# Patient Record
Sex: Female | Born: 1975 | Smoking: Never smoker
Health system: Southern US, Community
[De-identification: ages and names within clinical notes are randomized; demographics above are authoritative.]

## PROBLEM LIST (undated history)

## (undated) DIAGNOSIS — Z789 Other specified health status: Secondary | ICD-10-CM

## (undated) HISTORY — PX: NO PAST SURGERIES: SHX2092

---

## 2016-07-22 LAB — OB RESULTS CONSOLE RUBELLA ANTIBODY, IGM: Rubella: IMMUNE

## 2016-07-22 LAB — CYTOLOGY - PAP
Cystic Fibrosis Profile: NEGATIVE
Drug Screen, Urine: NEGATIVE
Glucose 1 Hr Prenatal, POC: 137 mg/dL
Pap: NEGATIVE
Urine Culture, OB: NEGATIVE

## 2016-07-22 LAB — OB RESULTS CONSOLE ABO/RH: RH TYPE: POSITIVE

## 2016-07-22 LAB — OB RESULTS CONSOLE ANTIBODY SCREEN: ANTIBODY SCREEN: NEGATIVE

## 2016-07-22 LAB — OB RESULTS CONSOLE RPR: RPR: NONREACTIVE

## 2016-07-22 LAB — OB RESULTS CONSOLE GC/CHLAMYDIA
Chlamydia: NEGATIVE
Gonorrhea: NEGATIVE

## 2016-07-22 LAB — OB RESULTS CONSOLE HIV ANTIBODY (ROUTINE TESTING): HIV: NONREACTIVE

## 2016-07-22 LAB — OB RESULTS CONSOLE HEPATITIS B SURFACE ANTIGEN: Hepatitis B Surface Ag: NEGATIVE

## 2016-07-22 LAB — OB RESULTS CONSOLE HGB/HCT, BLOOD
HCT: 31 %
HEMOGLOBIN: 9.6 g/dL

## 2016-07-22 LAB — OB RESULTS CONSOLE PLATELET COUNT: PLATELETS: 248 10*3/uL

## 2016-07-23 ENCOUNTER — Other Ambulatory Visit: Payer: Self-pay | Admitting: Nurse Practitioner

## 2016-07-23 DIAGNOSIS — N6310 Unspecified lump in the right breast, unspecified quadrant: Secondary | ICD-10-CM

## 2016-07-31 LAB — OB RESULTS CONSOLE PLATELET COUNT: PLATELETS: 31 10*3/uL

## 2016-07-31 LAB — GLUCOSE, 3 HOUR

## 2016-07-31 LAB — OB RESULTS CONSOLE HGB/HCT, BLOOD: HEMATOCRIT: 10 %

## 2016-07-31 LAB — OB RESULTS CONSOLE RPR: RPR: NONREACTIVE

## 2016-08-02 ENCOUNTER — Other Ambulatory Visit: Payer: Self-pay

## 2016-08-21 ENCOUNTER — Ambulatory Visit (INDEPENDENT_AMBULATORY_CARE_PROVIDER_SITE_OTHER): Payer: Medicaid Other | Admitting: Obstetrics and Gynecology

## 2016-08-21 ENCOUNTER — Encounter: Payer: Self-pay | Admitting: Obstetrics and Gynecology

## 2016-08-21 DIAGNOSIS — O133 Gestational [pregnancy-induced] hypertension without significant proteinuria, third trimester: Secondary | ICD-10-CM

## 2016-08-21 DIAGNOSIS — O09523 Supervision of elderly multigravida, third trimester: Secondary | ICD-10-CM | POA: Diagnosis not present

## 2016-08-21 DIAGNOSIS — O0993 Supervision of high risk pregnancy, unspecified, third trimester: Secondary | ICD-10-CM | POA: Diagnosis not present

## 2016-08-21 DIAGNOSIS — O09529 Supervision of elderly multigravida, unspecified trimester: Secondary | ICD-10-CM

## 2016-08-21 DIAGNOSIS — O0990 Supervision of high risk pregnancy, unspecified, unspecified trimester: Secondary | ICD-10-CM

## 2016-08-21 DIAGNOSIS — O139 Gestational [pregnancy-induced] hypertension without significant proteinuria, unspecified trimester: Secondary | ICD-10-CM

## 2016-08-21 DIAGNOSIS — N63 Unspecified lump in unspecified breast: Secondary | ICD-10-CM

## 2016-08-21 LAB — POCT URINALYSIS DIP (DEVICE)
Bilirubin Urine: NEGATIVE
GLUCOSE, UA: NEGATIVE mg/dL
HGB URINE DIPSTICK: NEGATIVE
Ketones, ur: NEGATIVE mg/dL
LEUKOCYTES UA: NEGATIVE
NITRITE: NEGATIVE
PROTEIN: NEGATIVE mg/dL
Specific Gravity, Urine: 1.02 (ref 1.005–1.030)
UROBILINOGEN UA: 0.2 mg/dL (ref 0.0–1.0)
pH: 5.5 (ref 5.0–8.0)

## 2016-08-21 NOTE — Progress Notes (Signed)
   PRENATAL VISIT NOTE  Subjective:  Savannah [name] is a 41 y.o. G1P0 at 5465w5d being seen today for ongoing prenatal care.  She is currently monitored for the following issues for this high-risk pregnancy and has Supervision of high risk pregnancy, antepartum on her problem list.  Patient reports no complaints.  Contractions: Not present. Vag. Bleeding: None.  Movement: Present. Denies leaking of fluid.    Patient moved from [place] recently. She is doing well. Her husband lives in the [place] but currently is working in [place]. She has no other family here. She reports no medical problems. She had one evelated BP at the heatlh department and so was referred to us for evaluation of Gestational hypertension. She has no other complaints.   The following portions of the patient's history were reviewed and updated as appropriate: allergies, current medications, past family history, past medical history, past social history, past surgical history and problem list. Problem list updated.  Objective:   Vitals:   08/21/16 0831  BP: 111/62  Pulse: 100  Weight: 161 lb 14.4 oz (73.4 kg)    Fetal Status: Fetal Heart Rate (bpm): 155   Movement: Present     General:  Alert, oriented and cooperative. Patient is in no acute distress.  Skin: Skin is warm and dry. No rash noted.   Cardiovascular: Normal heart rate noted, no m/r/g  Respiratory: Normal respiratory effort, CTAB   Abdomen: Soft, gravid, appropriate for gestational age. Pain/Pressure: Absent     Pelvic:  Cervical exam deferred        Extremities: Normal range of motion.  Edema: None  Mental Status: Normal mood and affect. Normal behavior. Normal judgment and thought content.   Assessment and Plan:  Pregnancy: G1P0 at 4065w5d  1. Pregnancy induced hypertension, antepartum -referred from [facility]. Patient has had a single elevated Bp to 140/90's. It is normal today. Will follow up closely, but no need to start testing at this time.  -no need to start  ASA at advanced gestation.    2. Supervision of high risk pregnancy, antepartum Labs reviewed from the health department all with in normal limits US reviewed - septal anatomy of the heart no well visualized, but no abnormalities seen. No need for repeat imaging at this time.  Preterm labor symptoms and general obstetric precautions including but not limited to vaginal bleeding, contractions, leaking of fluid and fetal movement were reviewed in detail with the patient. Please refer to After Visit Summary for other counseling recommendations.  Return in about 1 week (around 08/28/2016) for HROB.   Lorne SkeensSchenk, Jaliah Foody Michael, MD

## 2016-08-21 NOTE — Progress Notes (Signed)
Initial prenatal visit- transferring care from [facility] . [place] in [place]. Given prenatal education booklets. Signed up for babyscripps app.

## 2016-08-21 NOTE — Patient Instructions (Signed)
Third Trimester of Pregnancy The third trimester is from week 28 through week 40 (months 7 through 9). The third trimester is a time when the unborn baby (fetus) is growing rapidly. At the end of the ninth month, the fetus is about 20 inches in length and weighs 6-10 pounds. Body changes during your third trimester Your body will continue to go through many changes during pregnancy. The changes vary from woman to woman. During the third trimester:  Your weight will continue to increase. You can expect to gain 25-35 pounds (11-16 kg) by the end of the pregnancy.  You may begin to get stretch marks on your hips, abdomen, and breasts.  You may urinate more often because the fetus is moving lower into your pelvis and pressing on your bladder.  You may develop or continue to have heartburn. This is caused by increased hormones that slow down muscles in the digestive tract.  You may develop or continue to have constipation because increased hormones slow digestion and cause the muscles that push waste through your intestines to relax.  You may develop hemorrhoids. These are swollen veins (varicose veins) in the rectum that can itch or be painful.  You may develop swollen, bulging veins (varicose veins) in your legs.  You may have increased body aches in the pelvis, back, or thighs. This is due to weight gain and increased hormones that are relaxing your joints.  You may have changes in your hair. These can include thickening of your hair, rapid growth, and changes in texture. Some women also have hair loss during or after pregnancy, or hair that feels dry or thin. Your hair will most likely return to normal after your baby is born.  Your breasts will continue to grow and they will continue to become tender. A yellow fluid (colostrum) may leak from your breasts. This is the first milk you are producing for your baby.  Your belly button may stick out.  You may notice more swelling in your hands,  face, or ankles.  You may have increased tingling or numbness in your hands, arms, and legs. The skin on your belly may also feel numb.  You may feel short of breath because of your expanding uterus.  You may have more problems sleeping. This can be caused by the size of your belly, increased need to urinate, and an increase in your body's metabolism.  You may notice the fetus "dropping," or moving lower in your abdomen (lightening).  You may have increased vaginal discharge.  You may notice your joints feel loose and you may have pain around your pelvic bone.  What to expect at prenatal visits You will have prenatal exams every 2 weeks until week 36. Then you will have weekly prenatal exams. During a routine prenatal visit:  You will be weighed to make sure you and the baby are growing normally.  Your blood pressure will be taken.  Your abdomen will be measured to track your baby's growth.  The fetal heartbeat will be listened to.  Any test results from the previous visit will be discussed.  You may have a cervical check near your due date to see if your cervix has softened or thinned (effaced).  You will be tested for Group B streptococcus. This happens between 35 and 37 weeks.  Your health care provider may ask you:  What your birth plan is.  How you are feeling.  If you are feeling the baby move.  If you have had   any abnormal symptoms, such as leaking fluid, bleeding, severe headaches, or abdominal cramping.  If you are using any tobacco products, including cigarettes, chewing tobacco, and electronic cigarettes.  If you have any questions.  Other tests or screenings that may be performed during your third trimester include:  Blood tests that check for low iron levels (anemia).  Fetal testing to check the health, activity level, and growth of the fetus. Testing is done if you have certain medical conditions or if there are problems during the  pregnancy.  Nonstress test (NST). This test checks the health of your baby to make sure there are no signs of problems, such as the baby not getting enough oxygen. During this test, a belt is placed around your belly. The baby is made to move, and its heart rate is monitored during movement.  What is false labor? False labor is a condition in which you feel small, irregular tightenings of the muscles in the womb (contractions) that usually go away with rest, changing position, or drinking water. These are called Braxton Hicks contractions. Contractions may last for hours, days, or even weeks before true labor sets in. If contractions come at regular intervals, become more frequent, increase in intensity, or become painful, you should see your health care provider. What are the signs of labor?  Abdominal cramps.  Regular contractions that start at 10 minutes apart and become stronger and more frequent with time.  Contractions that start on the top of the uterus and spread down to the lower abdomen and back.  Increased pelvic pressure and dull back pain.  A watery or bloody mucus discharge that comes from the vagina.  Leaking of amniotic fluid. This is also known as your "water breaking." It could be a slow trickle or a gush. Let your health care provider know if it has a color or strange odor. If you have any of these signs, call your health care provider right away, even if it is before your due date. Follow these instructions at home: Medicines  Follow your health care provider's instructions regarding medicine use. Specific medicines may be either safe or unsafe to take during pregnancy.  Take a prenatal vitamin that contains at least 600 micrograms (mcg) of folic acid.  If you develop constipation, try taking a stool softener if your health care provider approves. Eating and drinking  Eat a balanced diet that includes fresh fruits and vegetables, whole grains, good sources of protein  such as meat, eggs, or tofu, and low-fat dairy. Your health care provider will help you determine the amount of weight gain that is right for you.  Avoid raw meat and uncooked cheese. These carry germs that can cause birth defects in the baby.  If you have low calcium intake from food, talk to your health care provider about whether you should take a daily calcium supplement.  Eat four or five small meals rather than three large meals a day.  Limit foods that are high in fat and processed sugars, such as fried and sweet foods.  To prevent constipation: ? Drink enough fluid to keep your urine clear or pale yellow. ? Eat foods that are high in fiber, such as fresh fruits and vegetables, whole grains, and beans. Activity  Exercise only as directed by your health care provider. Most women can continue their usual exercise routine during pregnancy. Try to exercise for 30 minutes at least 5 days a week. Stop exercising if you experience uterine contractions.  Avoid heavy   lifting.  Do not exercise in extreme heat or humidity, or at high altitudes.  Wear low-heel, comfortable shoes.  Practice good posture.  You may continue to have sex unless your health care provider tells you otherwise. Relieving pain and discomfort  Take frequent breaks and rest with your legs elevated if you have leg cramps or low back pain.  Take warm sitz baths to soothe any pain or discomfort caused by hemorrhoids. Use hemorrhoid cream if your health care provider approves.  Wear a good support bra to prevent discomfort from breast tenderness.  If you develop varicose veins: ? Wear support pantyhose or compression stockings as told by your healthcare provider. ? Elevate your feet for 15 minutes, 3-4 times a day. Prenatal care  Write down your questions. Take them to your prenatal visits.  Keep all your prenatal visits as told by your health care provider. This is important. Safety  Wear your seat belt at  all times when driving.  Make a list of emergency phone numbers, including numbers for family, friends, the hospital, and police and fire departments. General instructions  Avoid cat litter boxes and soil used by cats. These carry germs that can cause birth defects in the baby. If you have a cat, ask someone to clean the litter box for you.  Do not travel far distances unless it is absolutely necessary and only with the approval of your health care provider.  Do not use hot tubs, steam rooms, or saunas.  Do not drink alcohol.  Do not use any products that contain nicotine or tobacco, such as cigarettes and e-cigarettes. If you need help quitting, ask your health care provider.  Do not use any medicinal herbs or unprescribed drugs. These chemicals affect the formation and growth of the baby.  Do not douche or use tampons or scented sanitary pads.  Do not cross your legs for long periods of time.  To prepare for the arrival of your baby: ? Take prenatal classes to understand, practice, and ask questions about labor and delivery. ? Make a trial run to the hospital. ? Visit the hospital and tour the maternity area. ? Arrange for maternity or paternity leave through employers. ? Arrange for family and friends to take care of pets while you are in the hospital. ? Purchase a rear-facing car seat and make sure you know how to install it in your car. ? Pack your hospital bag. ? Prepare the baby's nursery. Make sure to remove all pillows and stuffed animals from the baby's crib to prevent suffocation.  Visit your dentist if you have not gone during your pregnancy. Use a soft toothbrush to brush your teeth and be gentle when you floss. Contact a health care provider if:  You are unsure if you are in labor or if your water has broken.  You become dizzy.  You have mild pelvic cramps, pelvic pressure, or nagging pain in your abdominal area.  You have lower back pain.  You have persistent  nausea, vomiting, or diarrhea.  You have an unusual or bad smelling vaginal discharge.  You have pain when you urinate. Get help right away if:  Your water breaks before 37 weeks.  You have regular contractions less than 5 minutes apart before 37 weeks.  You have a fever.  You are leaking fluid from your vagina.  You have spotting or bleeding from your vagina.  You have severe abdominal pain or cramping.  You have rapid weight loss or weight gain.    You have shortness of breath with chest pain.  You notice sudden or extreme swelling of your face, hands, ankles, feet, or legs.  Your baby makes fewer than 10 movements in 2 hours.  You have severe headaches that do not go away when you take medicine.  You have vision changes. Summary  The third trimester is from week 28 through week 40, months 7 through 9. The third trimester is a time when the unborn baby (fetus) is growing rapidly.  During the third trimester, your discomfort may increase as you and your baby continue to gain weight. You may have abdominal, leg, and back pain, sleeping problems, and an increased need to urinate.  During the third trimester your breasts will keep growing and they will continue to become tender. A yellow fluid (colostrum) may leak from your breasts. This is the first milk you are producing for your baby.  False labor is a condition in which you feel small, irregular tightenings of the muscles in the womb (contractions) that eventually go away. These are called Braxton Hicks contractions. Contractions may last for hours, days, or even weeks before true labor sets in.  Signs of labor can include: abdominal cramps; regular contractions that start at 10 minutes apart and become stronger and more frequent with time; watery or bloody mucus discharge that comes from the vagina; increased pelvic pressure and dull back pain; and leaking of amniotic fluid. This information is not intended to replace advice  given to you by your health care provider. Make sure you discuss any questions you have with your health care provider. Document Released: 03/26/2001 Document Revised: 09/07/2015 Document Reviewed: 06/02/2012 Elsevier Interactive Patient Education  2017 [employer]  

## 2016-08-22 ENCOUNTER — Encounter: Payer: Self-pay | Admitting: *Deleted

## 2016-08-26 ENCOUNTER — Encounter: Payer: Self-pay | Admitting: Obstetrics and Gynecology

## 2016-08-26 DIAGNOSIS — O09899 Supervision of other high risk pregnancies, unspecified trimester: Secondary | ICD-10-CM | POA: Insufficient documentation

## 2016-08-26 DIAGNOSIS — Z283 Underimmunization status: Secondary | ICD-10-CM

## 2016-08-28 ENCOUNTER — Encounter: Payer: Self-pay | Admitting: Family Medicine

## 2016-08-28 ENCOUNTER — Telehealth: Payer: Self-pay | Admitting: General Practice

## 2016-09-03 ENCOUNTER — Ambulatory Visit (INDEPENDENT_AMBULATORY_CARE_PROVIDER_SITE_OTHER): Payer: Self-pay | Admitting: Obstetrics and Gynecology

## 2016-09-03 VITALS — BP 122/78 | HR 105 | Wt 165.4 lb

## 2016-09-03 DIAGNOSIS — O0990 Supervision of high risk pregnancy, unspecified, unspecified trimester: Secondary | ICD-10-CM

## 2016-09-03 DIAGNOSIS — O0993 Supervision of high risk pregnancy, unspecified, third trimester: Secondary | ICD-10-CM

## 2016-09-03 DIAGNOSIS — O09523 Supervision of elderly multigravida, third trimester: Secondary | ICD-10-CM

## 2016-09-03 DIAGNOSIS — O09529 Supervision of elderly multigravida, unspecified trimester: Secondary | ICD-10-CM

## 2016-09-03 MED ORDER — PRENATAL VITAMIN PLUS LOW IRON 27-1 MG PO TABS: 1.0000 | ORAL_TABLET | Freq: Every day | ORAL | 9 refills | Status: AC

## 2016-09-03 NOTE — Progress Notes (Signed)
   PRENATAL VISIT NOTE  Subjective:  Savannah [name] is a 41 y.o. G2P0 at 6849w6d being seen today for ongoing prenatal care.  She is currently monitored for the following issues for this low-risk pregnancy and has Supervision of high risk pregnancy, antepartum; AMA (advanced maternal age) multigravida 35+; Breast lump; and Susceptible to varicella (non-immune), currently pregnant on her problem list.  Patient reports no complaints.  Contractions: Not present. Vag. Bleeding: None.  Movement: Present. Denies leaking of fluid.   The following portions of the patient's history were reviewed and updated as appropriate: allergies, current medications, past family history, past medical history, past social history, past surgical history and problem list. Problem list updated.  Objective:   Vitals:   09/03/16 1420  BP: 122/78  Pulse: (!) 105  Weight: 165 lb 6.4 oz (75 kg)    Fetal Status: Fetal Heart Rate (bpm): 148   Movement: Present     General:  Alert, oriented and cooperative. Patient is in no acute distress.  Skin: Skin is warm and dry. No rash noted.   Cardiovascular: Normal heart rate noted  Respiratory: Normal respiratory effort, no problems with respiration noted  Abdomen: Soft, gravid, appropriate for gestational age. Pain/Pressure: Absent     Pelvic:  Cervical exam deferred        Extremities: Normal range of motion.  Edema: None  Mental Status: Normal mood and affect. Normal behavior. Normal judgment and thought content.   Assessment and Plan:  Pregnancy: G2P0 at 6749w6d  1. Supervision of high risk pregnancy, antepartum - Prenatal Vit-Fe Fumarate-FA (PRENATAL VITAMIN PLUS LOW IRON) 27-1 MG TABS; Take 1 tablet by mouth daily.  Dispense: 30 tablet; Refill: 9 -BP remains normal - 1 elevated BP at health department - very anxious in the health care setting.  2. Anteprtum multigravida of advanced maternal age - Prenatal Vit-Fe Fumarate-FA (PRENATAL VITAMIN PLUS LOW IRON) 27-1  MG TABS; Take 1 tablet by mouth daily.  Dispense: 30 tablet; Refill: 9  Preterm labor symptoms and general obstetric precautions including but not limited to vaginal bleeding, contractions, leaking of fluid and fetal movement were reviewed in detail with the patient. Please refer to After Visit Summary for other counseling recommendations.  Return in about 2 weeks (around 09/17/2016) for LOB.   Ernestina PennaNicholas Schenk, MD

## 2016-09-03 NOTE — Patient Instructions (Signed)
Third Trimester of Pregnancy The third trimester is from week 28 through week 40 (months 7 through 9). The third trimester is a time when the unborn baby (fetus) is growing rapidly. At the end of the ninth month, the fetus is about 20 inches in length and weighs 6-10 pounds. Body changes during your third trimester Your body will continue to go through many changes during pregnancy. The changes vary from woman to woman. During the third trimester:  Your weight will continue to increase. You can expect to gain 25-35 pounds (11-16 kg) by the end of the pregnancy.  You may begin to get stretch marks on your hips, abdomen, and breasts.  You may urinate more often because the fetus is moving lower into your pelvis and pressing on your bladder.  You may develop or continue to have heartburn. This is caused by increased hormones that slow down muscles in the digestive tract.  You may develop or continue to have constipation because increased hormones slow digestion and cause the muscles that push waste through your intestines to relax.  You may develop hemorrhoids. These are swollen veins (varicose veins) in the rectum that can itch or be painful.  You may develop swollen, bulging veins (varicose veins) in your legs.  You may have increased body aches in the pelvis, back, or thighs. This is due to weight gain and increased hormones that are relaxing your joints.  You may have changes in your hair. These can include thickening of your hair, rapid growth, and changes in texture. Some women also have hair loss during or after pregnancy, or hair that feels dry or thin. Your hair will most likely return to normal after your baby is born.  Your breasts will continue to grow and they will continue to become tender. A yellow fluid (colostrum) may leak from your breasts. This is the first milk you are producing for your baby.  Your belly button may stick out.  You may notice more swelling in your hands,  face, or ankles.  You may have increased tingling or numbness in your hands, arms, and legs. The skin on your belly may also feel numb.  You may feel short of breath because of your expanding uterus.  You may have more problems sleeping. This can be caused by the size of your belly, increased need to urinate, and an increase in your body's metabolism.  You may notice the fetus "dropping," or moving lower in your abdomen (lightening).  You may have increased vaginal discharge.  You may notice your joints feel loose and you may have pain around your pelvic bone.  What to expect at prenatal visits You will have prenatal exams every 2 weeks until week 36. Then you will have weekly prenatal exams. During a routine prenatal visit:  You will be weighed to make sure you and the baby are growing normally.  Your blood pressure will be taken.  Your abdomen will be measured to track your baby's growth.  The fetal heartbeat will be listened to.  Any test results from the previous visit will be discussed.  You may have a cervical check near your due date to see if your cervix has softened or thinned (effaced).  You will be tested for Group B streptococcus. This happens between 35 and 37 weeks.  Your health care provider may ask you:  What your birth plan is.  How you are feeling.  If you are feeling the baby move.  If you have had   any abnormal symptoms, such as leaking fluid, bleeding, severe headaches, or abdominal cramping.  If you are using any tobacco products, including cigarettes, chewing tobacco, and electronic cigarettes.  If you have any questions.  Other tests or screenings that may be performed during your third trimester include:  Blood tests that check for low iron levels (anemia).  Fetal testing to check the health, activity level, and growth of the fetus. Testing is done if you have certain medical conditions or if there are problems during the  pregnancy.  Nonstress test (NST). This test checks the health of your baby to make sure there are no signs of problems, such as the baby not getting enough oxygen. During this test, a belt is placed around your belly. The baby is made to move, and its heart rate is monitored during movement.  What is false labor? False labor is a condition in which you feel small, irregular tightenings of the muscles in the womb (contractions) that usually go away with rest, changing position, or drinking water. These are called Braxton Hicks contractions. Contractions may last for hours, days, or even weeks before true labor sets in. If contractions come at regular intervals, become more frequent, increase in intensity, or become painful, you should see your health care provider. What are the signs of labor?  Abdominal cramps.  Regular contractions that start at 10 minutes apart and become stronger and more frequent with time.  Contractions that start on the top of the uterus and spread down to the lower abdomen and back.  Increased pelvic pressure and dull back pain.  A watery or bloody mucus discharge that comes from the vagina.  Leaking of amniotic fluid. This is also known as your "water breaking." It could be a slow trickle or a gush. Let your health care provider know if it has a color or strange odor. If you have any of these signs, call your health care provider right away, even if it is before your due date. Follow these instructions at home: Medicines  Follow your health care provider's instructions regarding medicine use. Specific medicines may be either safe or unsafe to take during pregnancy.  Take a prenatal vitamin that contains at least 600 micrograms (mcg) of folic acid.  If you develop constipation, try taking a stool softener if your health care provider approves. Eating and drinking  Eat a balanced diet that includes fresh fruits and vegetables, whole grains, good sources of protein  such as meat, eggs, or tofu, and low-fat dairy. Your health care provider will help you determine the amount of weight gain that is right for you.  Avoid raw meat and uncooked cheese. These carry germs that can cause birth defects in the baby.  If you have low calcium intake from food, talk to your health care provider about whether you should take a daily calcium supplement.  Eat four or five small meals rather than three large meals a day.  Limit foods that are high in fat and processed sugars, such as fried and sweet foods.  To prevent constipation: ? Drink enough fluid to keep your urine clear or pale yellow. ? Eat foods that are high in fiber, such as fresh fruits and vegetables, whole grains, and beans. Activity  Exercise only as directed by your health care provider. Most women can continue their usual exercise routine during pregnancy. Try to exercise for 30 minutes at least 5 days a week. Stop exercising if you experience uterine contractions.  Avoid heavy   lifting.  Do not exercise in extreme heat or humidity, or at high altitudes.  Wear low-heel, comfortable shoes.  Practice good posture.  You may continue to have sex unless your health care provider tells you otherwise. Relieving pain and discomfort  Take frequent breaks and rest with your legs elevated if you have leg cramps or low back pain.  Take warm sitz baths to soothe any pain or discomfort caused by hemorrhoids. Use hemorrhoid cream if your health care provider approves.  Wear a good support bra to prevent discomfort from breast tenderness.  If you develop varicose veins: ? Wear support pantyhose or compression stockings as told by your healthcare provider. ? Elevate your feet for 15 minutes, 3-4 times a day. Prenatal care  Write down your questions. Take them to your prenatal visits.  Keep all your prenatal visits as told by your health care provider. This is important. Safety  Wear your seat belt at  all times when driving.  Make a list of emergency phone numbers, including numbers for family, friends, the hospital, and police and fire departments. General instructions  Avoid cat litter boxes and soil used by cats. These carry germs that can cause birth defects in the baby. If you have a cat, ask someone to clean the litter box for you.  Do not travel far distances unless it is absolutely necessary and only with the approval of your health care provider.  Do not use hot tubs, steam rooms, or saunas.  Do not drink alcohol.  Do not use any products that contain nicotine or tobacco, such as cigarettes and e-cigarettes. If you need help quitting, ask your health care provider.  Do not use any medicinal herbs or unprescribed drugs. These chemicals affect the formation and growth of the baby.  Do not douche or use tampons or scented sanitary pads.  Do not cross your legs for long periods of time.  To prepare for the arrival of your baby: ? Take prenatal classes to understand, practice, and ask questions about labor and delivery. ? Make a trial run to the hospital. ? Visit the hospital and tour the maternity area. ? Arrange for maternity or paternity leave through employers. ? Arrange for family and friends to take care of pets while you are in the hospital. ? Purchase a rear-facing car seat and make sure you know how to install it in your car. ? Pack your hospital bag. ? Prepare the baby's nursery. Make sure to remove all pillows and stuffed animals from the baby's crib to prevent suffocation.  Visit your dentist if you have not gone during your pregnancy. Use a soft toothbrush to brush your teeth and be gentle when you floss. Contact a health care provider if:  You are unsure if you are in labor or if your water has broken.  You become dizzy.  You have mild pelvic cramps, pelvic pressure, or nagging pain in your abdominal area.  You have lower back pain.  You have persistent  nausea, vomiting, or diarrhea.  You have an unusual or bad smelling vaginal discharge.  You have pain when you urinate. Get help right away if:  Your water breaks before 37 weeks.  You have regular contractions less than 5 minutes apart before 37 weeks.  You have a fever.  You are leaking fluid from your vagina.  You have spotting or bleeding from your vagina.  You have severe abdominal pain or cramping.  You have rapid weight loss or weight gain.    You have shortness of breath with chest pain.  You notice sudden or extreme swelling of your face, hands, ankles, feet, or legs.  Your baby makes fewer than 10 movements in 2 hours.  You have severe headaches that do not go away when you take medicine.  You have vision changes. Summary  The third trimester is from week 28 through week 40, months 7 through 9. The third trimester is a time when the unborn baby (fetus) is growing rapidly.  During the third trimester, your discomfort may increase as you and your baby continue to gain weight. You may have abdominal, leg, and back pain, sleeping problems, and an increased need to urinate.  During the third trimester your breasts will keep growing and they will continue to become tender. A yellow fluid (colostrum) may leak from your breasts. This is the first milk you are producing for your baby.  False labor is a condition in which you feel small, irregular tightenings of the muscles in the womb (contractions) that eventually go away. These are called Braxton Hicks contractions. Contractions may last for hours, days, or even weeks before true labor sets in.  Signs of labor can include: abdominal cramps; regular contractions that start at 10 minutes apart and become stronger and more frequent with time; watery or bloody mucus discharge that comes from the vagina; increased pelvic pressure and dull back pain; and leaking of amniotic fluid. This information is not intended to replace advice  given to you by your health care provider. Make sure you discuss any questions you have with your health care provider. Document Released: 03/26/2001 Document Revised: 09/07/2015 Document Reviewed: 06/02/2012 Elsevier Interactive Patient Education  2017 [employer]  

## 2016-09-23 ENCOUNTER — Ambulatory Visit: Payer: Self-pay

## 2016-09-23 ENCOUNTER — Ambulatory Visit (INDEPENDENT_AMBULATORY_CARE_PROVIDER_SITE_OTHER): Payer: Medicaid Other | Admitting: Advanced Practice Midwife

## 2016-09-23 VITALS — BP 118/72 | HR 100 | Wt 168.3 lb

## 2016-09-23 DIAGNOSIS — Z113 Encounter for screening for infections with a predominantly sexual mode of transmission: Secondary | ICD-10-CM | POA: Diagnosis present

## 2016-09-23 DIAGNOSIS — O0993 Supervision of high risk pregnancy, unspecified, third trimester: Secondary | ICD-10-CM

## 2016-09-23 DIAGNOSIS — O26843 Uterine size-date discrepancy, third trimester: Secondary | ICD-10-CM

## 2016-09-23 DIAGNOSIS — O09529 Supervision of elderly multigravida, unspecified trimester: Secondary | ICD-10-CM

## 2016-09-23 DIAGNOSIS — O09523 Supervision of elderly multigravida, third trimester: Secondary | ICD-10-CM | POA: Diagnosis not present

## 2016-09-23 DIAGNOSIS — O0990 Supervision of high risk pregnancy, unspecified, unspecified trimester: Secondary | ICD-10-CM

## 2016-09-23 LAB — OB RESULTS CONSOLE GBS: STREP GROUP B AG: POSITIVE

## 2016-09-23 LAB — OB RESULTS CONSOLE GC/CHLAMYDIA: Gonorrhea: NEGATIVE

## 2016-09-23 NOTE — Addendum Note (Signed)
Addended by: Clearnce SorrelPICKARD, JILL S on: 09/23/2016 03:40 PM   Modules accepted: Orders

## 2016-09-23 NOTE — Progress Notes (Signed)
   PRENATAL VISIT NOTE  Subjective:  Savannah [name] is a 41 y.o. G2P0 at 9471w5d being seen today for ongoing prenatal care.  She is currently monitored for the following issues for this high-risk pregnancy and has Supervision of high risk pregnancy, antepartum; AMA (advanced maternal age) multigravida 35+; Breast lump; Susceptible to varicella (non-immune), currently pregnant; and Uterine size date discrepancy, antepartum condition, third trimester on her problem list.  Patient reports occasional contractions and anxiety. .  Contractions: Not present. Vag. Bleeding: None.  Movement: Present. Denies leaking of fluid.   The following portions of the patient's history were reviewed and updated as appropriate: allergies, current medications, past family history, past medical history, past social history, past surgical history and problem list. Problem list updated.  Objective:   Vitals:   09/23/16 1256  BP: 118/72  Pulse: 100  Weight: 168 lb 4.8 oz (76.3 kg)    Fetal Status: Fetal Heart Rate (bpm): 145 Fundal Height: 41 cm Movement: Present    NST reactive, AFI borderline elevated 24 cm General:  Alert, oriented and cooperative. Patient is in no acute distress.  Skin: Skin is warm and dry. No rash noted.   Cardiovascular: Normal heart rate noted  Respiratory: Normal respiratory effort, no problems with respiration noted  Abdomen: Soft, gravid, appropriate for gestational age. Pain/Pressure: Present     Pelvic:  Cervical exam declined        Extremities: Normal range of motion.  Edema: Trace  Mental Status: Normal mood and affect. Normal behavior. Normal judgment and thought content.   Assessment and Plan:  Pregnancy: G2P0 at 7271w5d  1. Supervision of high risk pregnancy, antepartum   2. Antepartum multigravida of advanced maternal age 74>40 Recommended Growth US, Twice weekly testing and explained rationale. Pt refuses due to cost/transportation issues. Will try to come for weekly  modified BPP's.  3. Uterine size date discrepancy, antepartum condition, third trimester - Declined  4. Elderly multigravida in third trimester  - Fetal nonstress test - US OB Limited  Term labor symptoms and general obstetric precautions including but not limited to vaginal bleeding, contractions, leaking of fluid and fetal movement were reviewed in detail with the patient. Please refer to After Visit Summary for other counseling recommendations.  Return in 1 week (on 09/30/2016) for Ob fu and NST/AFI.   Dorathy KinsmanVirginia Gatlin Kittell, CNM

## 2016-09-23 NOTE — Addendum Note (Signed)
Addended by: Faythe CasaBELLAMY, Omaya Nieland M on: 09/23/2016 05:12 PM   Modules accepted: Orders

## 2016-09-23 NOTE — Addendum Note (Signed)
Addended by: Clearnce SorrelPICKARD, [place] S on: 09/23/2016 03:45 PM   Modules accepted: Orders

## 2016-09-23 NOTE — Progress Notes (Signed)
Pt informed that the ultrasound is considered a limited OB ultrasound and is not intended to be a complete ultrasound exam.  Patient also informed that the ultrasound is not being completed with the intent of assessing for fetal or placental anomalies or any pelvic abnormalities.  Explained that the purpose of today's ultrasound is to assess for presentation and amniotic fluid volume.  Patient acknowledges the purpose of the exam and the limitations of the study.    

## 2016-09-23 NOTE — Patient Instructions (Signed)
Third Trimester of Pregnancy The third trimester is from week 28 through week 40 (months 7 through 9). The third trimester is a time when the unborn baby (fetus) is growing rapidly. At the end of the ninth month, the fetus is about 20 inches in length and weighs 6-10 pounds. Body changes during your third trimester Your body will continue to go through many changes during pregnancy. The changes vary from woman to woman. During the third trimester:  Your weight will continue to increase. You can expect to gain 25-35 pounds (11-16 kg) by the end of the pregnancy.  You may begin to get stretch marks on your hips, abdomen, and breasts.  You may urinate more often because the fetus is moving lower into your pelvis and pressing on your bladder.  You may develop or continue to have heartburn. This is caused by increased hormones that slow down muscles in the digestive tract.  You may develop or continue to have constipation because increased hormones slow digestion and cause the muscles that push waste through your intestines to relax.  You may develop hemorrhoids. These are swollen veins (varicose veins) in the rectum that can itch or be painful.  You may develop swollen, bulging veins (varicose veins) in your legs.  You may have increased body aches in the pelvis, back, or thighs. This is due to weight gain and increased hormones that are relaxing your joints.  You may have changes in your hair. These can include thickening of your hair, rapid growth, and changes in texture. Some women also have hair loss during or after pregnancy, or hair that feels dry or thin. Your hair will most likely return to normal after your baby is born.  Your breasts will continue to grow and they will continue to become tender. A yellow fluid (colostrum) may leak from your breasts. This is the first milk you are producing for your baby.  Your belly button may stick out.  You may notice more swelling in your hands,  face, or ankles.  You may have increased tingling or numbness in your hands, arms, and legs. The skin on your belly may also feel numb.  You may feel short of breath because of your expanding uterus.  You may have more problems sleeping. This can be caused by the size of your belly, increased need to urinate, and an increase in your body's metabolism.  You may notice the fetus "dropping," or moving lower in your abdomen (lightening).  You may have increased vaginal discharge.  You may notice your joints feel loose and you may have pain around your pelvic bone.  What to expect at prenatal visits You will have prenatal exams every 2 weeks until week 36. Then you will have weekly prenatal exams. During a routine prenatal visit:  You will be weighed to make sure you and the baby are growing normally.  Your blood pressure will be taken.  Your abdomen will be measured to track your baby's growth.  The fetal heartbeat will be listened to.  Any test results from the previous visit will be discussed.  You may have a cervical check near your due date to see if your cervix has softened or thinned (effaced).  You will be tested for Group B streptococcus. This happens between 35 and 37 weeks.  Your health care provider may ask you:  What your birth plan is.  How you are feeling.  If you are feeling the baby move.  If you have had   any abnormal symptoms, such as leaking fluid, bleeding, severe headaches, or abdominal cramping.  If you are using any tobacco products, including cigarettes, chewing tobacco, and electronic cigarettes.  If you have any questions.  Other tests or screenings that may be performed during your third trimester include:  Blood tests that check for low iron levels (anemia).  Fetal testing to check the health, activity level, and growth of the fetus. Testing is done if you have certain medical conditions or if there are problems during the  pregnancy.  Nonstress test (NST). This test checks the health of your baby to make sure there are no signs of problems, such as the baby not getting enough oxygen. During this test, a belt is placed around your belly. The baby is made to move, and its heart rate is monitored during movement.  What is false labor? False labor is a condition in which you feel small, irregular tightenings of the muscles in the womb (contractions) that usually go away with rest, changing position, or drinking water. These are called Braxton Hicks contractions. Contractions may last for hours, days, or even weeks before true labor sets in. If contractions come at regular intervals, become more frequent, increase in intensity, or become painful, you should see your health care provider. What are the signs of labor?  Abdominal cramps.  Regular contractions that start at 10 minutes apart and become stronger and more frequent with time.  Contractions that start on the top of the uterus and spread down to the lower abdomen and back.  Increased pelvic pressure and dull back pain.  A watery or bloody mucus discharge that comes from the vagina.  Leaking of amniotic fluid. This is also known as your "water breaking." It could be a slow trickle or a gush. Let your health care provider know if it has a color or strange odor. If you have any of these signs, call your health care provider right away, even if it is before your due date. Follow these instructions at home: Medicines  Follow your health care provider's instructions regarding medicine use. Specific medicines may be either safe or unsafe to take during pregnancy.  Take a prenatal vitamin that contains at least 600 micrograms (mcg) of folic acid.  If you develop constipation, try taking a stool softener if your health care provider approves. Eating and drinking  Eat a balanced diet that includes fresh fruits and vegetables, whole grains, good sources of protein  such as meat, eggs, or tofu, and low-fat dairy. Your health care provider will help you determine the amount of weight gain that is right for you.  Avoid raw meat and uncooked cheese. These carry germs that can cause birth defects in the baby.  If you have low calcium intake from food, talk to your health care provider about whether you should take a daily calcium supplement.  Eat four or five small meals rather than three large meals a day.  Limit foods that are high in fat and processed sugars, such as fried and sweet foods.  To prevent constipation: ? Drink enough fluid to keep your urine clear or pale yellow. ? Eat foods that are high in fiber, such as fresh fruits and vegetables, whole grains, and beans. Activity  Exercise only as directed by your health care provider. Most women can continue their usual exercise routine during pregnancy. Try to exercise for 30 minutes at least 5 days a week. Stop exercising if you experience uterine contractions.  Avoid heavy   lifting.  Do not exercise in extreme heat or humidity, or at high altitudes.  Wear low-heel, comfortable shoes.  Practice good posture.  You may continue to have sex unless your health care provider tells you otherwise. Relieving pain and discomfort  Take frequent breaks and rest with your legs elevated if you have leg cramps or low back pain.  Take warm sitz baths to soothe any pain or discomfort caused by hemorrhoids. Use hemorrhoid cream if your health care provider approves.  Wear a good support bra to prevent discomfort from breast tenderness.  If you develop varicose veins: ? Wear support pantyhose or compression stockings as told by your healthcare provider. ? Elevate your feet for 15 minutes, 3-4 times a day. Prenatal care  Write down your questions. Take them to your prenatal visits.  Keep all your prenatal visits as told by your health care provider. This is important. Safety  Wear your seat belt at  all times when driving.  Make a list of emergency phone numbers, including numbers for family, friends, the hospital, and police and fire departments. General instructions  Avoid cat litter boxes and soil used by cats. These carry germs that can cause birth defects in the baby. If you have a cat, ask someone to clean the litter box for you.  Do not travel far distances unless it is absolutely necessary and only with the approval of your health care provider.  Do not use hot tubs, steam rooms, or saunas.  Do not drink alcohol.  Do not use any products that contain nicotine or tobacco, such as cigarettes and e-cigarettes. If you need help quitting, ask your health care provider.  Do not use any medicinal herbs or unprescribed drugs. These chemicals affect the formation and growth of the baby.  Do not douche or use tampons or scented sanitary pads.  Do not cross your legs for long periods of time.  To prepare for the arrival of your baby: ? Take prenatal classes to understand, practice, and ask questions about labor and delivery. ? Make a trial run to the hospital. ? Visit the hospital and tour the maternity area. ? Arrange for maternity or paternity leave through employers. ? Arrange for family and friends to take care of pets while you are in the hospital. ? Purchase a rear-facing car seat and make sure you know how to install it in your car. ? Pack your hospital bag. ? Prepare the baby's nursery. Make sure to remove all pillows and stuffed animals from the baby's crib to prevent suffocation.  Visit your dentist if you have not gone during your pregnancy. Use a soft toothbrush to brush your teeth and be gentle when you floss. Contact a health care provider if:  You are unsure if you are in labor or if your water has broken.  You become dizzy.  You have mild pelvic cramps, pelvic pressure, or nagging pain in your abdominal area.  You have lower back pain.  You have persistent  nausea, vomiting, or diarrhea.  You have an unusual or bad smelling vaginal discharge.  You have pain when you urinate. Get help right away if:  Your water breaks before 37 weeks.  You have regular contractions less than 5 minutes apart before 37 weeks.  You have a fever.  You are leaking fluid from your vagina.  You have spotting or bleeding from your vagina.  You have severe abdominal pain or cramping.  You have rapid weight loss or weight gain.    You have shortness of breath with chest pain.  You notice sudden or extreme swelling of your face, hands, ankles, feet, or legs.  Your baby makes fewer than 10 movements in 2 hours.  You have severe headaches that do not go away when you take medicine.  You have vision changes. Summary  The third trimester is from week 28 through week 40, months 7 through 9. The third trimester is a time when the unborn baby (fetus) is growing rapidly.  During the third trimester, your discomfort may increase as you and your baby continue to gain weight. You may have abdominal, leg, and back pain, sleeping problems, and an increased need to urinate.  During the third trimester your breasts will keep growing and they will continue to become tender. A yellow fluid (colostrum) may leak from your breasts. This is the first milk you are producing for your baby.  False labor is a condition in which you feel small, irregular tightenings of the muscles in the womb (contractions) that eventually go away. These are called Braxton Hicks contractions. Contractions may last for hours, days, or even weeks before true labor sets in.  Signs of labor can include: abdominal cramps; regular contractions that start at 10 minutes apart and become stronger and more frequent with time; watery or bloody mucus discharge that comes from the vagina; increased pelvic pressure and dull back pain; and leaking of amniotic fluid. This information is not intended to replace advice  given to you by your health care provider. Make sure you discuss any questions you have with your health care provider. Document Released: 03/26/2001 Document Revised: 09/07/2015 Document Reviewed: 06/02/2012 Elsevier Interactive Patient Education  2017 [employer] Contraception Choices Contraception (birth control) is the use of any methods or devices to prevent pregnancy. Below are some methods to help avoid pregnancy. Hormonal methods  Contraceptive implant. This is a thin, plastic tube containing progesterone hormone. It does not contain estrogen hormone. Your health care provider inserts the tube in the inner part of the upper arm. The tube can remain in place for up to 3 years. After 3 years, the implant must be removed. The implant prevents the ovaries from releasing an egg (ovulation), thickens the cervical mucus to prevent sperm from entering the uterus, and thins the lining of the inside of the uterus.  Progesterone-only injections. These injections are given every 3 months by your health care provider to prevent pregnancy. This synthetic progesterone hormone stops the ovaries from releasing eggs. It also thickens cervical mucus and changes the uterine lining. This makes it harder for sperm to survive in the uterus.  Birth control pills. These pills contain estrogen and progesterone hormone. They work by preventing the ovaries from releasing eggs (ovulation). They also cause the cervical mucus to thicken, preventing the sperm from entering the uterus. Birth control pills are prescribed by a health care provider.Birth control pills can also be used to treat heavy periods.  Minipill. This type of birth control pill contains only the progesterone hormone. They are taken every day of each month and must be prescribed by your health care provider.  Birth control patch. The patch contains hormones similar to those in birth control pills. It must be changed once a week and is prescribed by a  health care provider.  Vaginal ring. The ring contains hormones similar to those in birth control pills. It is left in the vagina for 3 weeks, removed for 1 week, and then a new one is put   back in place. The patient must be comfortable inserting and removing the ring from the vagina.A health care provider's prescription is necessary.  Emergency contraception. Emergency contraceptives prevent pregnancy after unprotected sexual intercourse. This pill can be taken right after sex or up to 5 days after unprotected sex. It is most effective the sooner you take the pills after having sexual intercourse. Most emergency contraceptive pills are available without a prescription. Check with your pharmacist. Do not use emergency contraception as your only form of birth control. Barrier methods  Female condom. This is a thin sheath (latex or rubber) that is worn over the penis during sexual intercourse. It can be used with spermicide to increase effectiveness.  Female condom. This is a soft, loose-fitting sheath that is put into the vagina before sexual intercourse.  Diaphragm. This is a soft, latex, dome-shaped barrier that must be fitted by a health care provider. It is inserted into the vagina, along with a spermicidal jelly. It is inserted before intercourse. The diaphragm should be left in the vagina for 6 to 8 hours after intercourse.  Cervical cap. This is a round, soft, latex or plastic cup that fits over the cervix and must be fitted by a health care provider. The cap can be left in place for up to 48 hours after intercourse.  Sponge. This is a soft, circular piece of polyurethane foam. The sponge has spermicide in it. It is inserted into the vagina after wetting it and before sexual intercourse.  Spermicides. These are chemicals that kill or block sperm from entering the cervix and uterus. They come in the form of creams, jellies, suppositories, foam, or tablets. They do not require a prescription. They  are inserted into the vagina with an applicator before having sexual intercourse. The process must be repeated every time you have sexual intercourse. Intrauterine contraception  Intrauterine device (IUD). This is a T-shaped device that is put in a woman's uterus during a menstrual period to prevent pregnancy. There are 2 types: ? Copper IUD. This type of IUD is wrapped in copper wire and is placed inside the uterus. Copper makes the uterus and fallopian tubes produce a fluid that kills sperm. It can stay in place for 10 years. ? Hormone IUD. This type of IUD contains the hormone progestin (synthetic progesterone). The hormone thickens the cervical mucus and prevents sperm from entering the uterus, and it also thins the uterine lining to prevent implantation of a fertilized egg. The hormone can weaken or kill the sperm that get into the uterus. It can stay in place for 3-5 years, depending on which type of IUD is used. Permanent methods of contraception  Female tubal ligation. This is when the woman's fallopian tubes are surgically sealed, tied, or blocked to prevent the egg from traveling to the uterus.  Hysteroscopic sterilization. This involves placing a small coil or insert into each fallopian tube. Your doctor uses a technique called hysteroscopy to do the procedure. The device causes scar tissue to form. This results in permanent blockage of the fallopian tubes, so the sperm cannot fertilize the egg. It takes about 3 months after the procedure for the tubes to become blocked. You must use another form of birth control for these 3 months.  Female sterilization. This is when the female has the tubes that carry sperm tied off (vasectomy).This blocks sperm from entering the vagina during sexual intercourse. After the procedure, the man can still ejaculate fluid (semen). Natural planning methods  Natural family planning.   This is not having sexual intercourse or using a barrier method (condom, diaphragm,  cervical cap) on days the woman could become pregnant.  Calendar method. This is keeping track of the length of each menstrual cycle and identifying when you are fertile.  Ovulation method. This is avoiding sexual intercourse during ovulation.  Symptothermal method. This is avoiding sexual intercourse during ovulation, using a thermometer and ovulation symptoms.  Post-ovulation method. This is timing sexual intercourse after you have ovulated. Regardless of which type or method of contraception you choose, it is important that you use condoms to protect against the transmission of sexually transmitted infections (STIs). Talk with your health care provider about which form of contraception is most appropriate for you. This information is not intended to replace advice given to you by your health care provider. Make sure you discuss any questions you have with your health care provider. Document Released: 04/01/2005 Document Revised: 09/07/2015 Document Reviewed: 09/24/2012 Elsevier Interactive Patient Education  2017 [employer]  

## 2016-09-25 LAB — GC/CHLAMYDIA PROBE AMP (~~LOC~~) NOT AT ARMC
Chlamydia: NEGATIVE
Neisseria Gonorrhea: NEGATIVE

## 2016-09-27 LAB — CULTURE, BETA STREP (GROUP B ONLY): Strep Gp B Culture: POSITIVE — AB

## 2016-09-30 ENCOUNTER — Ambulatory Visit: Payer: Self-pay

## 2016-09-30 ENCOUNTER — Encounter: Payer: Self-pay | Admitting: Advanced Practice Midwife

## 2016-09-30 ENCOUNTER — Ambulatory Visit (INDEPENDENT_AMBULATORY_CARE_PROVIDER_SITE_OTHER): Payer: Medicaid Other | Admitting: Obstetrics & Gynecology

## 2016-09-30 VITALS — BP 134/78 | HR 104 | Wt 168.8 lb

## 2016-09-30 DIAGNOSIS — O0993 Supervision of high risk pregnancy, unspecified, third trimester: Secondary | ICD-10-CM

## 2016-09-30 DIAGNOSIS — O26843 Uterine size-date discrepancy, third trimester: Secondary | ICD-10-CM

## 2016-09-30 DIAGNOSIS — O0990 Supervision of high risk pregnancy, unspecified, unspecified trimester: Secondary | ICD-10-CM

## 2016-09-30 DIAGNOSIS — O09523 Supervision of elderly multigravida, third trimester: Secondary | ICD-10-CM

## 2016-09-30 DIAGNOSIS — O409XX0 Polyhydramnios, unspecified trimester, not applicable or unspecified: Secondary | ICD-10-CM

## 2016-09-30 DIAGNOSIS — O99820 Streptococcus B carrier state complicating pregnancy: Secondary | ICD-10-CM | POA: Insufficient documentation

## 2016-09-30 DIAGNOSIS — O403XX0 Polyhydramnios, third trimester, not applicable or unspecified: Secondary | ICD-10-CM

## 2016-09-30 LAB — FETAL NONSTRESS TEST

## 2016-09-30 NOTE — Progress Notes (Signed)
Scheduled for us for Wednesday 10/02/16 and / and Version for 10/03/16. Explained to patient and instructed to be npo for version. She voices understanding.

## 2016-09-30 NOTE — Progress Notes (Signed)
   PRENATAL VISIT NOTE  Subjective:  Savannah [name] is a 41 y.o. G2P0 at 6887w5d being seen today for ongoing prenatal care.  She is currently monitored for the following issues for this low-risk pregnancy and has Supervision of high risk pregnancy, antepartum; AMA (advanced maternal age) multigravida 35+; Breast lump; Susceptible to varicella (non-immune), currently pregnant; and Uterine size date discrepancy, antepartum condition, third trimester on her problem list.  Patient reports no complaints.  Contractions: Irregular. Vag. Bleeding: None.  Movement: Present. Denies leaking of fluid.   The following portions of the patient's history were reviewed and updated as appropriate: allergies, current medications, past family history, past medical history, past social history, past surgical history and problem list. Problem list updated.  Objective:   Vitals:   09/30/16 0954  BP: 134/78  Pulse: (!) 104  Weight: 168 lb 12.8 oz (76.6 kg)    Fetal Status: Fetal Heart Rate (bpm): NST   Movement: Present     General:  Alert, oriented and cooperative. Patient is in no acute distress.  Skin: Skin is warm and dry. No rash noted.   Cardiovascular: Normal heart rate noted  Respiratory: Normal respiratory effort, no problems with respiration noted  Abdomen: Soft, gravid, appropriate for gestational age. Pain/Pressure: Present     Pelvic:  Cervical exam deferred        Extremities: Normal range of motion.     Mental Status: Normal mood and affect. Normal behavior. Normal judgment and thought content.   Assessment and Plan:  Pregnancy: G2P0 at 8287w5d  1. Elderly multigravida in third trimester  - Fetal nonstress test - US OB Limited  2. Supervision of high risk pregnancy, antepartum Declined growth US  3. Uterine size date discrepancy, antepartum condition, third trimester AMA - Fetal nonstress test - US OB Limited  Term labor symptoms and general obstetric precautions including but  not limited to vaginal bleeding, contractions, leaking of fluid and fetal movement were reviewed in detail with the patient. Please refer to After Visit Summary for other counseling recommendations.  Return if symptoms worsen or fail to improve, for NST 2/week.   Scheryl DarterJames Mateusz Neilan, MD

## 2016-09-30 NOTE — Patient Instructions (Signed)
Vaginal Delivery Vaginal delivery means that you will give birth by pushing your baby out of your birth canal (vagina). A team of health care providers will help you before, during, and after vaginal delivery. Birth experiences are unique for every woman and every pregnancy, and birth experiences vary depending on where you choose to give birth. What should I do to prepare for my baby's birth? Before your baby is born, it is important to talk with your health care provider about:  Your labor and delivery preferences. These may include: ? Medicines that you may be given. ? How you will manage your pain. This might include non-medical pain relief techniques or injectable pain relief such as epidural analgesia. ? How you and your baby will be monitored during labor and delivery. ? Who may be in the labor and delivery room with you. ? Your feelings about surgical delivery of your baby (cesarean delivery, or C-section) if this becomes necessary. ? Your feelings about receiving donated blood through an IV tube (blood transfusion) if this becomes necessary.  Whether you are able: ? To take pictures or videos of the birth. ? To eat during labor and delivery. ? To move around, walk, or change positions during labor and delivery.  What to expect after your baby is born, such as: ? Whether delayed umbilical cord clamping and cutting is offered. ? Who will care for your baby right after birth. ? Medicines or tests that may be recommended for your baby. ? Whether breastfeeding is supported in your hospital or birth center. ? How long you will be in the hospital or birth center.  How any medical conditions you have may affect your baby or your labor and delivery experience.  To prepare for your baby's birth, you should also:  Attend all of your health care visits before delivery (prenatal visits) as recommended by your health care provider. This is important.  Prepare your home for your baby's  arrival. Make sure that you have: ? Diapers. ? Baby clothing. ? Feeding equipment. ? Safe sleeping arrangements for you and your baby.  Install a car seat in your vehicle. Have your car seat checked by a certified car seat installer to make sure that it is installed safely.  Think about who will help you with your new baby at home for at least the first several weeks after delivery.  What can I expect when I arrive at the birth center or hospital? Once you are in labor and have been admitted into the hospital or birth center, your health care provider may:  Review your pregnancy history and any concerns you have.  Insert an IV tube into one of your veins. This is used to give you fluids and medicines.  Check your blood pressure, pulse, temperature, and heart rate (vital signs).  Check whether your bag of water (amniotic sac) has broken (ruptured).  Talk with you about your birth plan and discuss pain control options.  Monitoring Your health care provider may monitor your contractions (uterine monitoring) and your baby's heart rate (fetal monitoring). You may need to be monitored:  Often, but not continuously (intermittently).  All the time or for long periods at a time (continuously). Continuous monitoring may be needed if: ? You are taking certain medicines, such as medicine to relieve pain or make your contractions stronger. ? You have pregnancy or labor complications.  Monitoring may be done by:  Placing a special stethoscope or a handheld monitoring device on your abdomen to   check your baby's heartbeat, and feeling your abdomen for contractions. This method of monitoring does not continuously record your baby's heartbeat or your contractions.  Placing monitors on your abdomen (external monitors) to record your baby's heartbeat and the frequency and length of contractions. You may not have to wear external monitors all the time.  Placing monitors inside of your uterus  (internal monitors) to record your baby's heartbeat and the frequency, length, and strength of your contractions. ? Your health care provider may use internal monitors if he or she needs more information about the strength of your contractions or your baby's heart rate. ? Internal monitors are put in place by passing a thin, flexible wire through your vagina and into your uterus. Depending on the type of monitor, it may remain in your uterus or on your baby's head until birth. ? Your health care provider will discuss the benefits and risks of internal monitoring with you and will ask for your permission before inserting the monitors.  Telemetry. This is a type of continuous monitoring that can be done with external or internal monitors. Instead of having to stay in bed, you are able to move around during telemetry. Ask your health care provider if telemetry is an option for you.  Physical exam Your health care provider may perform a physical exam. This may include:  Checking whether your baby is positioned: ? With the head toward your vagina (head-down). This is most common. ? With the head toward the top of your uterus (head-up or breech). If your baby is in a breech position, your health care provider may try to turn your baby to a head-down position so you can deliver vaginally. If it does not seem that your baby can be born vaginally, your provider may recommend surgery to deliver your baby. In rare cases, you may be able to deliver vaginally if your baby is head-up (breech delivery). ? Lying sideways (transverse). Babies that are lying sideways cannot be delivered vaginally.  Checking your cervix to determine: ? Whether it is thinning out (effacing). ? Whether it is opening up (dilating). ? How low your baby has moved into your birth canal.  What are the three stages of labor and delivery?  Normal labor and delivery is divided into the following three stages: Stage 1  Stage 1 is the  longest stage of labor, and it can last for hours or days. Stage 1 includes: ? Early labor. This is when contractions may be irregular, or regular and mild. Generally, early labor contractions are more than 10 minutes apart. ? Active labor. This is when contractions get longer, more regular, more frequent, and more intense. ? The transition phase. This is when contractions happen very close together, are very intense, and may last longer than during any other part of labor.  Contractions generally feel mild, infrequent, and irregular at first. They get stronger, more frequent (about every 2-3 minutes), and more regular as you progress from early labor through active labor and transition.  Many women progress through stage 1 naturally, but you may need help to continue making progress. If this happens, your health care provider may talk with you about: ? Rupturing your amniotic sac if it has not ruptured yet. ? Giving you medicine to help make your contractions stronger and more frequent.  Stage 1 ends when your cervix is completely dilated to 4 inches (10 cm) and completely effaced. This happens at the end of the transition phase. Stage 2  Once   your cervix is completely effaced and dilated to 4 inches (10 cm), you may start to feel an urge to push. It is common for the body to naturally take a rest before feeling the urge to push, especially if you received an epidural or certain other pain medicines. This rest period may last for up to 1-2 hours, depending on your unique labor experience.  During stage 2, contractions are generally less painful, because pushing helps relieve contraction pain. Instead of contraction pain, you may feel stretching and burning pain, especially when the widest part of your baby's head passes through the vaginal opening (crowning).  Your health care provider will closely monitor your pushing progress and your baby's progress through the vagina during stage 2.  Your  health care provider may massage the area of skin between your vaginal opening and anus (perineum) or apply warm compresses to your perineum. This helps it stretch as the baby's head starts to crown, which can help prevent perineal tearing. ? In some cases, an incision may be made in your perineum (episiotomy) to allow the baby to pass through the vaginal opening. An episiotomy helps to make the opening of the vagina larger to allow more room for the baby to fit through.  It is very important to breathe and focus so your health care provider can control the delivery of your baby's head. Your health care provider may have you decrease the intensity of your pushing, to help prevent perineal tearing.  After delivery of your baby's head, the shoulders and the rest of the body generally deliver very quickly and without difficulty.  Once your baby is delivered, the umbilical cord may be cut right away, or this may be delayed for 1-2 minutes, depending on your baby's health. This may vary among health care providers, hospitals, and birth centers.  If you and your baby are healthy enough, your baby may be placed on your chest or abdomen to help maintain the baby's temperature and to help you bond with each other. Some mothers and babies start breastfeeding at this time. Your health care team will dry your baby and help keep your baby warm during this time.  Your baby may need immediate care if he or she: ? Showed signs of distress during labor. ? Has a medical condition. ? Was born too early (prematurely). ? Had a bowel movement before birth (meconium). ? Shows signs of difficulty transitioning from being inside the uterus to being outside of the uterus. If you are planning to breastfeed, your health care team will help you begin a feeding. Stage 3  The third stage of labor starts immediately after the birth of your baby and ends after you deliver the placenta. The placenta is an organ that develops  during pregnancy to provide oxygen and nutrients to your baby in the womb.  Delivering the placenta may require some pushing, and you may have mild contractions. Breastfeeding can stimulate contractions to help you deliver the placenta.  After the placenta is delivered, your uterus should tighten (contract) and become firm. This helps to stop bleeding in your uterus. To help your uterus contract and to control bleeding, your health care provider may: ? Give you medicine by injection, through an IV tube, by mouth, or through your rectum (rectally). ? Massage your abdomen or perform a vaginal exam to remove any blood clots that are left in your uterus. ? Empty your bladder by placing a thin, flexible tube (catheter) into your bladder. ? Encourage   you to breastfeed your baby. After labor is over, you and your baby will be monitored closely to ensure that you are both healthy until you are ready to go home. Your health care team will teach you how to care for yourself and your baby. This information is not intended to replace advice given to you by your health care provider. Make sure you discuss any questions you have with your health care provider. Document Released: 01/09/2008 Document Revised: 10/20/2015 Document Reviewed: 04/16/2015 [employer] Interactive Patient Education  2018 [employer]  

## 2016-10-01 ENCOUNTER — Telehealth (HOSPITAL_COMMUNITY): Payer: Self-pay | Admitting: *Deleted

## 2016-10-01 NOTE — Telephone Encounter (Signed)
Preadmission screen  

## 2016-10-01 NOTE — Progress Notes (Signed)
Pt informed that the ultrasound is considered a limited OB ultrasound and is not intended to be a complete ultrasound exam.  Patient also informed that the ultrasound is not being completed with the intent of assessing for fetal or placental anomalies or any pelvic abnormalities.  Explained that the purpose of today's ultrasound is to assess for presentation and amniotic fluid volume.  Patient acknowledges the purpose of the exam and the limitations of the study.    

## 2016-10-02 ENCOUNTER — Other Ambulatory Visit: Payer: Self-pay | Admitting: Obstetrics & Gynecology

## 2016-10-02 ENCOUNTER — Encounter (HOSPITAL_COMMUNITY): Payer: Self-pay

## 2016-10-02 ENCOUNTER — Ambulatory Visit (HOSPITAL_COMMUNITY)
Admission: RE | Admit: 2016-10-02 | Discharge: 2016-10-02 | Disposition: A | Discharge status: Home / Self Care | Payer: Medicaid Other | Source: Ambulatory Visit | Attending: Obstetrics & Gynecology | Admitting: Obstetrics & Gynecology

## 2016-10-02 VITALS — BP 111/72 | HR 107 | Wt 168.4 lb

## 2016-10-02 DIAGNOSIS — O403XX0 Polyhydramnios, third trimester, not applicable or unspecified: Secondary | ICD-10-CM

## 2016-10-02 DIAGNOSIS — O26843 Uterine size-date discrepancy, third trimester: Secondary | ICD-10-CM | POA: Insufficient documentation

## 2016-10-02 DIAGNOSIS — Z3A38 38 weeks gestation of pregnancy: Secondary | ICD-10-CM | POA: Insufficient documentation

## 2016-10-02 DIAGNOSIS — O99820 Streptococcus B carrier state complicating pregnancy: Secondary | ICD-10-CM

## 2016-10-02 DIAGNOSIS — O0990 Supervision of high risk pregnancy, unspecified, unspecified trimester: Secondary | ICD-10-CM

## 2016-10-02 DIAGNOSIS — Z3689 Encounter for other specified antenatal screening: Secondary | ICD-10-CM

## 2016-10-02 DIAGNOSIS — Z283 Underimmunization status: Secondary | ICD-10-CM

## 2016-10-02 DIAGNOSIS — O09523 Supervision of elderly multigravida, third trimester: Secondary | ICD-10-CM

## 2016-10-02 DIAGNOSIS — O99212 Obesity complicating pregnancy, second trimester: Secondary | ICD-10-CM

## 2016-10-02 DIAGNOSIS — N63 Unspecified lump in unspecified breast: Secondary | ICD-10-CM

## 2016-10-02 DIAGNOSIS — O99213 Obesity complicating pregnancy, third trimester: Secondary | ICD-10-CM | POA: Insufficient documentation

## 2016-10-02 DIAGNOSIS — O09899 Supervision of other high risk pregnancies, unspecified trimester: Secondary | ICD-10-CM

## 2016-10-02 DIAGNOSIS — Z2839 Other underimmunization status: Secondary | ICD-10-CM

## 2016-10-02 DIAGNOSIS — O409XX0 Polyhydramnios, unspecified trimester, not applicable or unspecified: Secondary | ICD-10-CM

## 2016-10-02 HISTORY — DX: Other specified health status: Z78.9

## 2016-10-02 NOTE — Procedures (Signed)
Savannah [name] 05/05/1975 [id number]  Fetus A Non-Stress Test Interpretation for 10/02/16  Indication: Unsatisfactory BPP  Fetal Heart Rate A Mode: External Baseline Rate (A): 145 bpm Variability: Moderate Accelerations: 15 x 15 Decelerations: None Multiple birth?: No  Uterine Activity Mode: Palpation, Toco Contraction Frequency (min): 1-3.5 Contraction Duration (sec): 40-80 Contraction Quality: Mild Resting Tone Palpated: Relaxed Resting Time: Adequate  Interpretation (Fetal Testing) Nonstress Test Interpretation: Reactive Overall Impression: Reassuring for gestational age Comments: Reviewed tracing with Dr. Ezzard StandingNewman

## 2016-10-02 NOTE — Progress Notes (Signed)
Pt came down from MFM and informed us that she was told that her version should be canceled due to baby's change of position.  Looking at pt's US, shows baby in cephalic presentation.  L&D notified to cancel version.

## 2016-10-03 ENCOUNTER — Inpatient Hospital Stay (HOSPITAL_COMMUNITY): Admission: RE | Admit: 2016-10-03 | Payer: Self-pay | Source: Ambulatory Visit

## 2016-10-07 ENCOUNTER — Telehealth (HOSPITAL_COMMUNITY): Payer: Self-pay | Admitting: *Deleted

## 2016-10-07 ENCOUNTER — Ambulatory Visit (INDEPENDENT_AMBULATORY_CARE_PROVIDER_SITE_OTHER): Payer: Medicaid Other | Admitting: Family Medicine

## 2016-10-07 ENCOUNTER — Ambulatory Visit (HOSPITAL_COMMUNITY): Payer: Self-pay

## 2016-10-07 VITALS — BP 121/72 | HR 103 | Wt 169.2 lb

## 2016-10-07 DIAGNOSIS — O09523 Supervision of elderly multigravida, third trimester: Secondary | ICD-10-CM | POA: Diagnosis not present

## 2016-10-07 DIAGNOSIS — O403XX0 Polyhydramnios, third trimester, not applicable or unspecified: Secondary | ICD-10-CM

## 2016-10-07 DIAGNOSIS — O0993 Supervision of high risk pregnancy, unspecified, third trimester: Secondary | ICD-10-CM | POA: Diagnosis present

## 2016-10-07 DIAGNOSIS — O26843 Uterine size-date discrepancy, third trimester: Secondary | ICD-10-CM | POA: Diagnosis not present

## 2016-10-07 DIAGNOSIS — O409XX0 Polyhydramnios, unspecified trimester, not applicable or unspecified: Secondary | ICD-10-CM

## 2016-10-07 DIAGNOSIS — O0990 Supervision of high risk pregnancy, unspecified, unspecified trimester: Secondary | ICD-10-CM

## 2016-10-07 NOTE — Patient Instructions (Signed)
Breastfeeding Deciding to breastfeed is one of the best choices you can make for you and your baby. A change in hormones during pregnancy causes your breast tissue to grow and increases the number and size of your milk ducts. These hormones also allow proteins, sugars, and fats from your blood supply to make breast milk in your milk-producing glands. Hormones prevent breast milk from being released before your baby is born as well as prompt milk flow after birth. Once breastfeeding has begun, thoughts of your baby, as well as his or her sucking or crying, can stimulate the release of milk from your milk-producing glands. Benefits of breastfeeding For Your Baby  Your first milk (colostrum) helps your baby's digestive system function better.  There are antibodies in your milk that help your baby fight off infections.  Your baby has a lower incidence of asthma, allergies, and sudden infant death syndrome.  The nutrients in breast milk are better for your baby than infant formulas and are designed uniquely for your baby's needs.  Breast milk improves your baby's brain development.  Your baby is less likely to develop other conditions, such as childhood obesity, asthma, or type 2 diabetes mellitus.  For You  Breastfeeding helps to create a very special bond between you and your baby.  Breastfeeding is convenient. Breast milk is always available at the correct temperature and costs nothing.  Breastfeeding helps to burn calories and helps you lose the weight gained during pregnancy.  Breastfeeding makes your uterus contract to its prepregnancy size faster and slows bleeding (lochia) after you give birth.  Breastfeeding helps to lower your risk of developing type 2 diabetes mellitus, osteoporosis, and breast or ovarian cancer later in life.  Signs that your baby is hungry Early Signs of Hunger  Increased alertness or activity.  Stretching.  Movement of the head from side to  side.  Movement of the head and opening of the mouth when the corner of the mouth or cheek is stroked (rooting).  Increased sucking sounds, smacking lips, cooing, sighing, or squeaking.  Hand-to-mouth movements.  Increased sucking of fingers or hands.  Late Signs of Hunger  Fussing.  Intermittent crying.  Extreme Signs of Hunger Signs of extreme hunger will require calming and consoling before your baby will be able to breastfeed successfully. Do not wait for the following signs of extreme hunger to occur before you initiate breastfeeding:  Restlessness.  A loud, strong cry.  Screaming.  Breastfeeding basics Breastfeeding Initiation  Find a comfortable place to sit or lie down, with your neck and back well supported.  Place a pillow or rolled up blanket under your baby to bring him or her to the level of your breast (if you are seated). Nursing pillows are specially designed to help support your arms and your baby while you breastfeed.  Make sure that your baby's abdomen is facing your abdomen.  Gently massage your breast. With your fingertips, massage from your chest wall toward your nipple in a circular motion. This encourages milk flow. You may need to continue this action during the feeding if your milk flows slowly.  Support your breast with 4 fingers underneath and your thumb above your nipple. Make sure your fingers are well away from your nipple and your baby's mouth.  Stroke your baby's lips gently with your finger or nipple.  When your baby's mouth is open wide enough, quickly bring your baby to your breast, placing your entire nipple and as much of the colored area   around your nipple (areola) as possible into your baby's mouth. ? More areola should be visible above your baby's upper lip than below the lower lip. ? Your baby's tongue should be between his or her lower gum and your breast.  Ensure that your baby's mouth is correctly positioned around your nipple  (latched). Your baby's lips should create a seal on your breast and be turned out (everted).  It is common for your baby to suck about 2-3 minutes in order to start the flow of breast milk.  Latching Teaching your baby how to latch on to your breast properly is very important. An improper latch can cause nipple pain and decreased milk supply for you and poor weight gain in your baby. Also, if your baby is not latched onto your nipple properly, he or she may swallow some air during feeding. This can make your baby fussy. Burping your baby when you switch breasts during the feeding can help to get rid of the air. However, teaching your baby to latch on properly is still the best way to prevent fussiness from swallowing air while breastfeeding. Signs that your baby has successfully latched on to your nipple:  Silent tugging or silent sucking, without causing you pain.  Swallowing heard between every 3-4 sucks.  Muscle movement above and in front of his or her ears while sucking.  Signs that your baby has not successfully latched on to nipple:  Sucking sounds or smacking sounds from your baby while breastfeeding.  Nipple pain.  If you think your baby has not latched on correctly, slip your finger into the corner of your baby's mouth to break the suction and place it between your baby's gums. Attempt breastfeeding initiation again. Signs of Successful Breastfeeding Signs from your baby:  A gradual decrease in the number of sucks or complete cessation of sucking.  Falling asleep.  Relaxation of his or her body.  Retention of a small amount of milk in his or her mouth.  Letting go of your breast by himself or herself.  Signs from you:  Breasts that have increased in firmness, weight, and size 1-3 hours after feeding.  Breasts that are softer immediately after breastfeeding.  Increased milk volume, as well as a change in milk consistency and color by the fifth day of  breastfeeding.  Nipples that are not sore, cracked, or bleeding.  Signs That Your Baby is Getting Enough Milk  Wetting at least 1-2 diapers during the first 24 hours after birth.  Wetting at least 5-6 diapers every 24 hours for the first week after birth. The urine should be clear or pale yellow by 5 days after birth.  Wetting 6-8 diapers every 24 hours as your baby continues to grow and develop.  At least 3 stools in a 24-hour period by age 5 days. The stool should be soft and yellow.  At least 3 stools in a 24-hour period by age 7 days. The stool should be seedy and yellow.  No loss of weight greater than 10% of birth weight during the first 3 days of age.  Average weight gain of 4-7 ounces (113-198 g) per week after age 4 days.  Consistent daily weight gain by age 5 days, without weight loss after the age of 2 weeks.  After a feeding, your baby may spit up a small amount. This is common. Breastfeeding frequency and duration Frequent feeding will help you make more milk and can prevent sore nipples and breast engorgement. Breastfeed when   you feel the need to reduce the fullness of your breasts or when your baby shows signs of hunger. This is called "breastfeeding on demand." Avoid introducing a pacifier to your baby while you are working to establish breastfeeding (the first 4-6 weeks after your baby is born). After this time you may choose to use a pacifier. Research has shown that pacifier use during the first year of a baby's life decreases the risk of sudden infant death syndrome (SIDS). Allow your baby to feed on each breast as long as he or she wants. Breastfeed until your baby is finished feeding. When your baby unlatches or falls asleep while feeding from the first breast, offer the second breast. Because newborns are often sleepy in the first few weeks of life, you may need to awaken your baby to get him or her to feed. Breastfeeding times will vary from baby to baby. However,  the following rules can serve as a guide to help you ensure that your baby is properly fed:  Newborns (babies 4 weeks of age or younger) may breastfeed every 1-3 hours.  Newborns should not go longer than 3 hours during the day or 5 hours during the night without breastfeeding.  You should breastfeed your baby a minimum of 8 times in a 24-hour period until you begin to introduce solid foods to your baby at around 6 months of age.  Breast milk pumping Pumping and storing breast milk allows you to ensure that your baby is exclusively fed your breast milk, even at times when you are unable to breastfeed. This is especially important if you are going back to work while you are still breastfeeding or when you are not able to be present during feedings. Your lactation consultant can give you guidelines on how long it is safe to store breast milk. A breast pump is a machine that allows you to pump milk from your breast into a sterile bottle. The pumped breast milk can then be stored in a refrigerator or freezer. Some breast pumps are operated by hand, while others use electricity. Ask your lactation consultant which type will work best for you. Breast pumps can be purchased, but some hospitals and breastfeeding support groups lease breast pumps on a monthly basis. A lactation consultant can teach you how to hand express breast milk, if you prefer not to use a pump. Caring for your breasts while you breastfeed Nipples can become dry, cracked, and sore while breastfeeding. The following recommendations can help keep your breasts moisturized and healthy:  Avoid using soap on your nipples.  Wear a supportive bra. Although not required, special nursing bras and tank tops are designed to allow access to your breasts for breastfeeding without taking off your entire bra or top. Avoid wearing underwire-style bras or extremely tight bras.  Air dry your nipples for 3-4minutes after each feeding.  Use only cotton  bra pads to absorb leaked breast milk. Leaking of breast milk between feedings is normal.  Use lanolin on your nipples after breastfeeding. Lanolin helps to maintain your skin's normal moisture barrier. If you use pure lanolin, you do not need to wash it off before feeding your baby again. Pure lanolin is not toxic to your baby. You may also hand express a few drops of breast milk and gently massage that milk into your nipples and allow the milk to air dry.  In the first few weeks after giving birth, some women experience extremely full breasts (engorgement). Engorgement can make your   breasts feel heavy, warm, and tender to the touch. Engorgement peaks within 3-5 days after you give birth. The following recommendations can help ease engorgement:  Completely empty your breasts while breastfeeding or pumping. You may want to start by applying warm, moist heat (in the shower or with warm water-soaked hand towels) just before feeding or pumping. This increases circulation and helps the milk flow. If your baby does not completely empty your breasts while breastfeeding, pump any extra milk after he or she is finished.  Wear a snug bra (nursing or regular) or tank top for 1-2 days to signal your body to slightly decrease milk production.  Apply ice packs to your breasts, unless this is too uncomfortable for you.  Make sure that your baby is latched on and positioned properly while breastfeeding.  If engorgement persists after 48 hours of following these recommendations, contact your health care provider or a lactation consultant. Overall health care recommendations while breastfeeding  Eat healthy foods. Alternate between meals and snacks, eating 3 of each per day. Because what you eat affects your breast milk, some of the foods may make your baby more irritable than usual. Avoid eating these foods if you are sure that they are negatively affecting your baby.  Drink milk, fruit juice, and water to  satisfy your thirst (about 10 glasses a day).  Rest often, relax, and continue to take your prenatal vitamins to prevent fatigue, stress, and anemia.  Continue breast self-awareness checks.  Avoid chewing and smoking tobacco. Chemicals from cigarettes that pass into breast milk and exposure to secondhand smoke may harm your baby.  Avoid alcohol and drug use, including marijuana. Some medicines that may be harmful to your baby can pass through breast milk. It is important to ask your health care provider before taking any medicine, including all over-the-counter and prescription medicine as well as vitamin and herbal supplements. It is possible to become pregnant while breastfeeding. If birth control is desired, ask your health care provider about options that will be safe for your baby. Contact a health care provider if:  You feel like you want to stop breastfeeding or have become frustrated with breastfeeding.  You have painful breasts or nipples.  Your nipples are cracked or bleeding.  Your breasts are red, tender, or warm.  You have a swollen area on either breast.  You have a fever or chills.  You have nausea or vomiting.  You have drainage other than breast milk from your nipples.  Your breasts do not become full before feedings by the fifth day after you give birth.  You feel sad and depressed.  Your baby is too sleepy to eat well.  Your baby is having trouble sleeping.  Your baby is wetting less than 3 diapers in a 24-hour period.  Your baby has less than 3 stools in a 24-hour period.  Your baby's skin or the white part of his or her eyes becomes yellow.  Your baby is not gaining weight by 5 days of age. Get help right away if:  Your baby is overly tired (lethargic) and does not want to wake up and feed.  Your baby develops an unexplained fever. This information is not intended to replace advice given to you by your health care provider. Make sure you discuss  any questions you have with your health care provider. Document Released: 04/01/2005 Document Revised: 09/13/2015 Document Reviewed: 09/23/2012 Elsevier Interactive Patient Education  2017 [employer]  

## 2016-10-07 NOTE — Progress Notes (Signed)
Here for obfu/nst. Scheduled for IOL for 10/09/16 12:00( come in 10/08/16 11:45pm)

## 2016-10-07 NOTE — Progress Notes (Signed)
    PRENATAL VISIT NOTE  Subjective:  Savannah [name] is a 41 y.o. G1P0 at 5235w5d being seen today for ongoing prenatal care.  She is currently monitored for the following issues for this high-risk pregnancy and has Supervision of high risk pregnancy, antepartum; AMA (advanced maternal age) multigravida 35+; Breast lump; Susceptible to varicella (non-immune), currently pregnant; Uterine size date discrepancy, antepartum condition, third trimester; Polyhydramnios affecting pregnancy; and Group B Streptococcus carrier, antepartum on her problem list.  Patient reports no complaints.  Contractions: Irregular. Vag. Bleeding: None.  Movement: Present. Denies leaking of fluid.   The following portions of the patient's history were reviewed and updated as appropriate: allergies, current medications, past family history, past medical history, past social history, past surgical history and problem list. Problem list updated.  Objective:   Vitals:   10/07/16 0950  BP: 121/72  Pulse: (!) 103  Weight: 169 lb 3.2 oz (76.7 kg)    Fetal Status: Fetal Heart Rate (bpm): NST   Movement: Present     General:  Alert, oriented and cooperative. Patient is in no acute distress.  Skin: Skin is warm and dry. No rash noted.   Cardiovascular: Normal heart rate noted  Respiratory: Normal respiratory effort, no problems with respiration noted  Abdomen: Soft, gravid, appropriate for gestational age. Pain/Pressure: Absent     Pelvic:  Cervical exam deferred        Extremities: Normal range of motion.  Edema: Trace  Mental Status: Normal mood and affect. Normal behavior. Normal judgment and thought content.  NST:  Baseline: 140 bpm, Variability: Good {> 6 bpm), Accelerations: Reactive and Decelerations: Absent   Assessment and Plan:  Pregnancy: G1P0 at 2135w5d  1. Supervision of high risk pregnancy, antepartum   2. Elderly multigravida in third trimester - Fetal nonstress test  3. Polyhydramnios affecting  pregnancy For IOL at 39 1/7 wks - Fetal nonstress test  4. Uterine size date discrepancy, antepartum condition, third trimester S>D due to LGA fetus + poly--passed 3 hour - Fetal nonstress test  Term labor symptoms and general obstetric precautions including but not limited to vaginal bleeding, contractions, leaking of fluid and fetal movement were reviewed in detail with the patient. Please refer to After Visit Summary for other counseling recommendations.  Return in about 6 weeks (around 11/18/2016) for 6 week pp, pp check.   Reva Boresanya S Tasha Diaz, MD

## 2016-10-07 NOTE — Telephone Encounter (Signed)
Preadmission screen  

## 2016-10-09 ENCOUNTER — Inpatient Hospital Stay (HOSPITAL_COMMUNITY): Payer: Self-pay

## 2016-10-10 ENCOUNTER — Inpatient Hospital Stay (HOSPITAL_COMMUNITY)
Admission: RE | Admit: 2016-10-10 | Discharge: 2016-10-14 | DRG: 766 | Disposition: A | Discharge status: Home / Self Care | Payer: Medicaid Other | Source: Ambulatory Visit | Attending: Obstetrics & Gynecology | Admitting: Obstetrics & Gynecology

## 2016-10-10 ENCOUNTER — Encounter (HOSPITAL_COMMUNITY)
Admission: RE | Disposition: A | Discharge status: Home / Self Care | Payer: Self-pay | Source: Ambulatory Visit | Attending: Obstetrics & Gynecology

## 2016-10-10 ENCOUNTER — Inpatient Hospital Stay (HOSPITAL_COMMUNITY): Payer: Medicaid Other | Admitting: Anesthesiology

## 2016-10-10 ENCOUNTER — Encounter (HOSPITAL_COMMUNITY): Payer: Self-pay

## 2016-10-10 VITALS — BP 125/71 | HR 84 | Temp 98.6°F | Resp 16 | Ht 67.0 in | Wt 169.0 lb

## 2016-10-10 DIAGNOSIS — O3663X0 Maternal care for excessive fetal growth, third trimester, not applicable or unspecified: Secondary | ICD-10-CM | POA: Diagnosis present

## 2016-10-10 DIAGNOSIS — O403XX0 Polyhydramnios, third trimester, not applicable or unspecified: Secondary | ICD-10-CM | POA: Diagnosis present

## 2016-10-10 DIAGNOSIS — O409XX0 Polyhydramnios, unspecified trimester, not applicable or unspecified: Secondary | ICD-10-CM | POA: Diagnosis present

## 2016-10-10 DIAGNOSIS — Z3A39 39 weeks gestation of pregnancy: Secondary | ICD-10-CM

## 2016-10-10 DIAGNOSIS — O99824 Streptococcus B carrier state complicating childbirth: Secondary | ICD-10-CM | POA: Diagnosis present

## 2016-10-10 DIAGNOSIS — O0990 Supervision of high risk pregnancy, unspecified, unspecified trimester: Secondary | ICD-10-CM

## 2016-10-10 LAB — TYPE AND SCREEN
ABO/RH(D): O POS
ANTIBODY SCREEN: NEGATIVE

## 2016-10-10 LAB — CBC
HEMATOCRIT: 37.2 % (ref 36.0–46.0)
Hemoglobin: 12.4 g/dL (ref 12.0–15.0)
MCH: 29.2 pg (ref 26.0–34.0)
MCHC: 33.3 g/dL (ref 30.0–36.0)
MCV: 87.5 fL (ref 78.0–100.0)
Platelets: 177 10*3/uL (ref 150–400)
RBC: 4.25 MIL/uL (ref 3.87–5.11)
RDW: 18.6 % — AB (ref 11.5–15.5)
WBC: 5.5 10*3/uL (ref 4.0–10.5)

## 2016-10-10 LAB — ABO/RH: ABO/RH(D): O POS

## 2016-10-10 LAB — RPR: RPR Ser Ql: NONREACTIVE

## 2016-10-10 SURGERY — Surgical Case
Anesthesia: Epidural

## 2016-10-10 MED ORDER — PENICILLIN G POT IN DEXTROSE 60000 UNIT/ML IV SOLN
3.0000 10*6.[IU] | INTRAVENOUS | Status: DC
  Filled 2016-10-10 (×3): qty 50

## 2016-10-10 MED ORDER — OXYTOCIN 40 UNITS IN LACTATED RINGERS INFUSION - SIMPLE MED
1.0000 m[IU]/min | INTRAVENOUS | Status: DC
  Administered 2016-10-10: 2 m[IU]/min via INTRAVENOUS

## 2016-10-10 MED ORDER — FENTANYL 2.5 MCG/ML BUPIVACAINE 1/10 % EPIDURAL INFUSION (WH - ANES)
14.0000 mL/h | INTRAMUSCULAR | Status: DC | PRN
  Administered 2016-10-10: 14 mL/h via EPIDURAL
  Filled 2016-10-10: qty 100

## 2016-10-10 MED ORDER — TERBUTALINE SULFATE 1 MG/ML IJ SOLN: 0.2500 mg | Freq: Once | INTRAMUSCULAR | Status: DC | PRN

## 2016-10-10 MED ORDER — OXYCODONE-ACETAMINOPHEN 5-325 MG PO TABS: 2.0000 | ORAL_TABLET | ORAL | Status: DC | PRN

## 2016-10-10 MED ORDER — OXYTOCIN BOLUS FROM INFUSION: 500.0000 mL | Freq: Once | INTRAVENOUS | Status: DC

## 2016-10-10 MED ORDER — EPHEDRINE 5 MG/ML INJ: 10.0000 mg | INTRAVENOUS | Status: DC | PRN

## 2016-10-10 MED ORDER — MISOPROSTOL 50MCG HALF TABLET
50.0000 ug | ORAL_TABLET | ORAL | Status: DC | PRN
  Administered 2016-10-10: 50 ug via ORAL
  Filled 2016-10-10: qty 1

## 2016-10-10 MED ORDER — PHENYLEPHRINE 40 MCG/ML (10ML) SYRINGE FOR IV PUSH (FOR BLOOD PRESSURE SUPPORT)
80.0000 ug | PREFILLED_SYRINGE | INTRAVENOUS | Status: DC | PRN
  Filled 2016-10-10: qty 10

## 2016-10-10 MED ORDER — LIDOCAINE HCL (PF) 1 % IJ SOLN
INTRAMUSCULAR | Status: DC | PRN
  Administered 2016-10-10 (×2): 5 mL via EPIDURAL

## 2016-10-10 MED ORDER — MISOPROSTOL 25 MCG QUARTER TABLET
25.0000 ug | ORAL_TABLET | ORAL | Status: DC | PRN
  Administered 2016-10-10 (×2): 25 ug via VAGINAL
  Filled 2016-10-10 (×2): qty 1

## 2016-10-10 MED ORDER — LIDOCAINE HCL (PF) 1 % IJ SOLN: 30.0000 mL | INTRAMUSCULAR | Status: DC | PRN

## 2016-10-10 MED ORDER — ONDANSETRON HCL 4 MG/2ML IJ SOLN: 4.0000 mg | Freq: Four times a day (QID) | INTRAMUSCULAR | Status: DC | PRN

## 2016-10-10 MED ORDER — PENICILLIN G POT IN DEXTROSE 60000 UNIT/ML IV SOLN
3.0000 10*6.[IU] | INTRAVENOUS | Status: DC
  Administered 2016-10-10 (×3): 3 10*6.[IU] via INTRAVENOUS
  Filled 2016-10-10 (×7): qty 50

## 2016-10-10 MED ORDER — OXYCODONE-ACETAMINOPHEN 5-325 MG PO TABS: 1.0000 | ORAL_TABLET | ORAL | Status: DC | PRN

## 2016-10-10 MED ORDER — PHENYLEPHRINE 40 MCG/ML (10ML) SYRINGE FOR IV PUSH (FOR BLOOD PRESSURE SUPPORT): 80.0000 ug | PREFILLED_SYRINGE | INTRAVENOUS | Status: DC | PRN

## 2016-10-10 MED ORDER — LACTATED RINGERS IV SOLN
500.0000 mL | INTRAVENOUS | Status: DC | PRN
  Administered 2016-10-10: 500 mL via INTRAVENOUS

## 2016-10-10 MED ORDER — ACETAMINOPHEN 325 MG PO TABS: 650.0000 mg | ORAL_TABLET | ORAL | Status: DC | PRN

## 2016-10-10 MED ORDER — EPHEDRINE 5 MG/ML INJ
10.0000 mg | INTRAVENOUS | Status: DC | PRN
Start: 2016-10-10 — End: 2016-10-11

## 2016-10-10 MED ORDER — SOD CITRATE-CITRIC ACID 500-334 MG/5ML PO SOLN
30.0000 mL | ORAL | Status: DC | PRN
  Administered 2016-10-11: 30 mL via ORAL
  Filled 2016-10-10: qty 15

## 2016-10-10 MED ORDER — LACTATED RINGERS IV SOLN
INTRAVENOUS | Status: DC
  Administered 2016-10-10 (×4): via INTRAVENOUS

## 2016-10-10 MED ORDER — LACTATED RINGERS IV SOLN
500.0000 mL | Freq: Once | INTRAVENOUS | Status: AC
  Administered 2016-10-10: 500 mL via INTRAVENOUS

## 2016-10-10 MED ORDER — FLEET ENEMA 7-19 GM/118ML RE ENEM: 1.0000 | ENEMA | RECTAL | Status: DC | PRN

## 2016-10-10 MED ORDER — DEXTROSE 5 % IV SOLN
5.0000 10*6.[IU] | Freq: Once | INTRAVENOUS | Status: AC
  Administered 2016-10-10: 5 10*6.[IU] via INTRAVENOUS
  Filled 2016-10-10: qty 5

## 2016-10-10 MED ORDER — OXYTOCIN 40 UNITS IN LACTATED RINGERS INFUSION - SIMPLE MED
2.5000 [IU]/h | INTRAVENOUS | Status: DC
  Filled 2016-10-10: qty 1000

## 2016-10-10 MED ORDER — DIPHENHYDRAMINE HCL 50 MG/ML IJ SOLN: 12.5000 mg | INTRAMUSCULAR | Status: DC | PRN

## 2016-10-10 SURGICAL SUPPLY — 41 items
BENZOIN TINCTURE PRP APPL 2/3 (GAUZE/BANDAGES/DRESSINGS) ×3 IMPLANT
BLADE TIP J-PLASMA PRECISE LAP (MISCELLANEOUS) ×3 IMPLANT
CHLORAPREP W/TINT 26ML (MISCELLANEOUS) ×3 IMPLANT
CLAMP CORD UMBIL (MISCELLANEOUS) IMPLANT
CLOSURE STERI-STRIP 1/2X4 (GAUZE/BANDAGES/DRESSINGS) ×1
CLOSURE WOUND 1/2 X4 (GAUZE/BANDAGES/DRESSINGS) ×1
CLOTH BEACON ORANGE TIMEOUT ST (SAFETY) ×3 IMPLANT
CLSR STERI-STRIP ANTIMIC 1/2X4 (GAUZE/BANDAGES/DRESSINGS) ×2 IMPLANT
DECANTER SPIKE VIAL GLASS SM (MISCELLANEOUS) ×3 IMPLANT
DRSG OPSITE POSTOP 4X10 (GAUZE/BANDAGES/DRESSINGS) ×3 IMPLANT
ELECT REM PT RETURN 9FT ADLT (ELECTROSURGICAL) ×3
ELECTRODE REM PT RTRN 9FT ADLT (ELECTROSURGICAL) ×1 IMPLANT
EXTRACTOR VACUUM KIWI (MISCELLANEOUS) IMPLANT
GLOVE BIO SURGEON STRL SZ7 (GLOVE) ×3 IMPLANT
GLOVE BIOGEL PI IND STRL 7.0 (GLOVE) ×2 IMPLANT
GLOVE BIOGEL PI INDICATOR 7.0 (GLOVE) ×4
GOWN STRL REUS W/TWL LRG LVL3 (GOWN DISPOSABLE) ×6 IMPLANT
GOWN STRL REUS W/TWL XL LVL3 (GOWN DISPOSABLE) ×3 IMPLANT
KIT ABG SYR 3ML LUER SLIP (SYRINGE) IMPLANT
NEEDLE HYPO 22GX1.5 SAFETY (NEEDLE) ×3 IMPLANT
NEEDLE HYPO 25X5/8 SAFETYGLIDE (NEEDLE) IMPLANT
NS IRRIG 1000ML POUR BTL (IV SOLUTION) ×3 IMPLANT
PACK C SECTION WH (CUSTOM PROCEDURE TRAY) ×3 IMPLANT
PAD OB MATERNITY 4.3X12.25 (PERSONAL CARE ITEMS) ×3 IMPLANT
PENCIL SMOKE EVAC W/HOLSTER (ELECTROSURGICAL) ×3 IMPLANT
RETRACTOR WND ALEXIS 25 LRG (MISCELLANEOUS) ×1 IMPLANT
RTRCTR C-SECT PINK 25CM LRG (MISCELLANEOUS) ×3 IMPLANT
RTRCTR WOUND ALEXIS 25CM LRG (MISCELLANEOUS) ×3
SPONGE LAP 18X18 X RAY DECT (DISPOSABLE) ×3 IMPLANT
SPONGE SURGIFOAM ABS GEL 12-7 (HEMOSTASIS) IMPLANT
STRIP CLOSURE SKIN 1/2X4 (GAUZE/BANDAGES/DRESSINGS) ×2 IMPLANT
SUT PDS AB 0 CTX 60 (SUTURE) IMPLANT
SUT PLAIN 0 NONE (SUTURE) IMPLANT
SUT SILK 0 TIES 10X30 (SUTURE) IMPLANT
SUT VIC AB 0 CT1 36 (SUTURE) ×9 IMPLANT
SUT VIC AB 3-0 CT1 27 (SUTURE) ×2
SUT VIC AB 3-0 CT1 TAPERPNT 27 (SUTURE) ×1 IMPLANT
SUT VIC AB 4-0 KS 27 (SUTURE) ×3 IMPLANT
SYR CONTROL 10ML LL (SYRINGE) ×3 IMPLANT
TOWEL OR 17X24 6PK STRL BLUE (TOWEL DISPOSABLE) ×3 IMPLANT
TRAY FOLEY BAG SILVER LF 14FR (SET/KITS/TRAYS/PACK) ×3 IMPLANT

## 2016-10-10 NOTE — Progress Notes (Signed)
G1P0 presents for IOL for poly/AMA, currently comfortable.  Dilation: Closed Effacement (%): 50 Station: -3 Presentation: Vertex Exam by:: Ma Hillock. Neill, SNM   FHT: 120bpm, mod var, occasional accel, no decels TOCO: q142min   A/P: Labor: s/p cytotec x2 Pain: currently controlled, IV meds PRN FWB: category I tracing   Continue expectant management Anticipate SVD  Howard PouchLauren Umar Patmon, MD PGY-1 [facility]

## 2016-10-10 NOTE — Progress Notes (Signed)
Savannah [name] is a 41 y.o. G1P0 at 1962w1d  admitted for induction of labor due to poly/AMA.  Subjective: Patient comfortable with epidural.   Objective: Vitals:   10/10/16 1901 10/10/16 1930 10/10/16 2000 10/10/16 2030  BP: 112/66 111/67 104/76 (!) 98/58  Pulse: 97 94 98 (!) 102  Resp: 16 14 14 16   Temp:   98.2 F (36.8 C)   TempSrc:   Oral   SpO2:      Weight:      Height:       Total I/O In: -  Out: 750 [Urine:750]  FHT:  FHR: 145 bpm, variability: minimal ,  accelerations:  Present,  decelerations:  Present variable UC:   regular, every 3-4 minutes SVE:   Dilation: 4 Effacement (%): 100 Station: -2 Exam by:: Rayfield Citizenaroline Student CNM Pitocin @ 2 mu/min  Labs: Lab Results  Component Value Date   WBC 5.5 10/10/2016   HGB 12.4 10/10/2016   HCT 37.2 10/10/2016   MCV 87.5 10/10/2016   PLT 177 10/10/2016    Assessment / Plan: IUP at term. IOL for poly/AMA. GBS pos  Discussed with patient purpose, risks and benefits of placement of IUPC. Patient agreeable to plan. IUPC placed without difficulty.   Plan: continue pitocin. Anticipate NSVD.  Cleone SlimCaroline Amesha Bailey SNM 10/10/2016, 8:47 PM

## 2016-10-10 NOTE — H&P (Signed)
Trudi Ida Alaniz is a 41 y.o. female G1P0 at 39.1 weeks presenting for induction of labor for advanced maternal age and polyhydramnios. On 6/20, her AFI was 27cm and EFW was 8lb 15oz, >90%. She denies any contractions, vaginal bleeding, or leaking of fluid. Reports good fetal movement.   Clinic  [facility] (transfer from [facility]) Prenatal Labs  Dating  LMP Blood type:   o pos  Genetic Screen 1 Screen:    AFP:     Quad:     NIPS: Antibody: neg  Anatomic [place] Nml female Rubella:  Immune  GTT Early:               Third trimester: nl 3 hour RPR:   NR  Flu vaccine 07/22/16 HBsAg:   Neg  TDaP vaccine 07/22/2016                              Rhogam:n/a HIV:   NR  Baby Food Breast                                            GBS: Pos (For PCN allergy, check sensitivities)  Contraception Undecided Pap: negative  Circumcision Girl--Karen   Pediatrician     Support Person Husband     OB History    Gravida Para Term Preterm AB Living   1             SAB TAB Ectopic Multiple Live Births                 Past Medical History:  Diagnosis Date  . Medical history non-contributory    Past Surgical History:  Procedure Laterality Date  . NO PAST SURGERIES     Family History: family history is not on file. Social History:  reports that she has never smoked. She has never used smokeless tobacco. She reports that she does not drink alcohol or use drugs.    Maternal Diabetes: No Genetic Screening: Normal Maternal Ultrasounds/Referrals: Normal Fetal Ultrasounds or other Referrals:  None Maternal Substance Abuse:  No Significant Maternal Medications:  None Significant Maternal Lab Results:  Lab values include: Group B Strep positive Other Comments:  None  Review of Systems  Constitutional: Negative.  Negative for chills and fever.  Respiratory: Negative.  Negative for shortness of breath.   Cardiovascular: Negative.  Negative for chest pain.  Gastrointestinal: Negative.  Negative for abdominal pain.   Genitourinary: Negative.   Neurological: Negative.    Maternal Medical History:  Reason for admission: IOL for poly and AMA  Contractions: Frequency: irregular.   Perceived severity is mild.    Fetal activity: Perceived fetal activity is normal.   Last perceived fetal movement was within the past hour.    Prenatal Complications - Diabetes: none.      Temperature 99.2 F (37.3 C), temperature source Oral, height 5\' 7"  (1.702 m), weight 169 lb (76.7 kg), last menstrual period 01/10/2016, unknown if currently breastfeeding. Maternal Exam:  Uterine Assessment: Contraction strength is mild.  Contraction frequency is regular.   Abdomen: Patient reports no abdominal tenderness. Estimated fetal weight is 8lb 15oz on 6/20.   Fetal presentation: vertex  Introitus: Normal vulva. Ferning test: not done.  Nitrazine test: not done. Amniotic fluid character: not assessed.  Cervix: Cervix evaluated by digital exam.     Fetal  Exam Fetal Monitor Review: Mode: ultrasound.   Baseline rate: 140.  Variability: moderate (6-25 bpm).   Pattern: accelerations present and no decelerations.    Fetal State Assessment: Category I - tracings are normal.     Physical Exam  Nursing note and vitals reviewed. Constitutional: She appears well-developed and well-nourished.  HENT:  Head: Normocephalic and atraumatic.  Eyes: Conjunctivae are normal. No scleral icterus.  Cardiovascular: Normal rate, regular rhythm and normal heart sounds.   Respiratory: Effort normal and breath sounds normal. No respiratory distress.  GI: Soft. She exhibits no distension. There is no tenderness.  Genitourinary: Vagina normal and uterus normal.  Neurological: She is alert.  Skin: Skin is warm and dry.  Psychiatric: She has a normal mood and affect. Her behavior is normal. Judgment and thought content normal.    Prenatal labs: ABO, Rh: O/Positive/-- (04/09 0000) Antibody: Negative (04/09 0000) Rubella: Immune  (04/09 0000) RPR: Nonreactive (04/18 0000)  HBsAg: Negative (04/09 0000)  HIV: Non-reactive (04/09 0000)  GBS: Positive (06/11 0000)   Assessment/Plan: IUP at term. IOL for poly/AMA. GBS pos   Admit to birthing suites Cytotec for cervical ripening and plan foley bulb when able PCN for GBS prophylaxis when active or ruptured. Anticipate NSVD.  Cleone SlimCaroline Neill SNM 10/10/2016, 1:14 AM   I confirm that I have verified the information documented in the nurse midwife student's note and that I have also personally reperformed the physical exam and all medical decision making activities.   Thressa ShellerHeather Hogan 2:13 AM 10/10/16

## 2016-10-10 NOTE — Progress Notes (Signed)
Subjective: Patient sleeping.  Objective: Vitals:   10/10/16 0047 10/10/16 0048  BP:  129/76  Pulse:  91  Resp:  18  Temp: 99.2 F (37.3 C)   TempSrc: Oral   Weight: 169 lb (76.7 kg)   Height: 5\' 7"  (1.702 m)    FHT:  FHR: 145 bpm, variability: moderate,  accelerations:  Present,  decelerations:  Absent UC:   regular, every 2-5 minutes SVE:   Dilation: Closed Effacement (%): 50 Station: -3 Exam by:: Cletis MediaK. Anderson, RN  Labs: Lab Results  Component Value Date   WBC 5.5 10/10/2016   HGB 12.4 10/10/2016   HCT 37.2 10/10/2016   MCV 87.5 10/10/2016   PLT 177 10/10/2016    Assessment / Plan: IUP at term. IOL for poly/AMA. GBS pos  Plan: continue cytotec. Attempt foley bulb when able.   Cleone SlimCaroline Ames Hoban SNM 10/10/2016, 6:02 AM

## 2016-10-10 NOTE — Anesthesia Pain Management Evaluation Note (Signed)
  CRNA Pain Management Visit Note  Patient: Savannah [name], 41 y.o., female  "Hello I am a member of the anesthesia team at [facility]. We have an anesthesia team available at all times to provide care throughout the hospital, including epidural management and anesthesia for C-section. I don't know your plan for the delivery whether it a natural birth, water birth, IV sedation, nitrous supplementation, doula or epidural, but we want to meet your pain goals."   1.Was your pain managed to your expectations on prior hospitalizations?   yes 2.What is your expectation for pain management during this hospitalization?     Natural with support  3.How can we help you reach that goal? support  Record the patient's initial score and the patient's pain goal.   Pain: 5/10  Pain Goal: 5/10 [facility] wants you to be able to say your pain was always managed very well.  Salome ArntSterling, Louise Victory Marie 10/10/2016

## 2016-10-10 NOTE — Anesthesia Procedure Notes (Signed)
Epidural Patient location during procedure: OB Start time: 10/10/2016 4:06 PM End time: 10/10/2016 4:26 PM  Staffing Anesthesiologist: Heather RobertsSINGER, Pura Picinich Performed: anesthesiologist   Preanesthetic Checklist Completed: patient identified, site marked, pre-op evaluation, timeout performed, IV checked, risks and benefits discussed and monitors and equipment checked  Epidural Patient position: sitting Prep: DuraPrep Patient monitoring: heart rate, cardiac monitor, continuous pulse ox and blood pressure Approach: midline Location: L2-L3 Injection technique: LOR saline  Needle:  Needle type: Tuohy  Needle gauge: 17 G Needle length: 9 cm Needle insertion depth: 6 cm Catheter size: 20 Guage Catheter at skin depth: 11 cm Test dose: negative and Other  Assessment Events: blood not aspirated, injection not painful, no injection resistance and negative IV test  Additional Notes Informed consent obtained prior to proceeding including risk of failure, 1% risk of PDPH, risk of minor discomfort and bruising.  Discussed rare but serious complications including epidural abscess, permanent nerve injury, epidural hematoma.  Discussed alternatives to epidural analgesia and patient desires to proceed.  Timeout performed pre-procedure verifying patient name, procedure, and platelet count.  Patient tolerated procedure well.

## 2016-10-10 NOTE — Progress Notes (Signed)
Patient ID: [name], female   DOB: 1975-12-26, 41 y.o.   MRN: [id number]  S: Patient seen & examined for progress of labor. Patient comfortable with epidural, continually leaking large amounts of fluid.    O:  Vitals:   10/10/16 1640 10/10/16 1641 10/10/16 1701 10/10/16 1731  BP:  115/80 113/63 (!) 105/57  Pulse:  88 75 76  Resp:  18 16 16   Temp:      TempSrc:      SpO2: 98%     Weight:      Height:        Dilation: 3 Effacement (%): 80 Cervical Position: Middle Station: -2 Presentation: Vertex Exam by:: Dr. Omer JackMumaw   FHT: 145 bpm, mod var, +accels, early decels TOCO: q4-375min   A/P: Start pitocin Continue expectant management Anticipate SVD

## 2016-10-10 NOTE — Anesthesia Preprocedure Evaluation (Signed)
Anesthesia Evaluation  Patient identified by MRN, date of birth, ID band Patient awake    Reviewed: Allergy & Precautions, NPO status , Patient's Chart, lab work & pertinent test results  Airway Mallampati: II  TM Distance: >3 FB Neck ROM: Full    Dental no notable dental hx. (+) Dental Advisory Given   Pulmonary neg pulmonary ROS,    Pulmonary exam normal        Cardiovascular negative cardio ROS Normal cardiovascular exam     Neuro/Psych negative neurological ROS  negative psych ROS   GI/Hepatic negative GI ROS, Neg liver ROS,   Endo/Other  negative endocrine ROS  Renal/GU negative Renal ROS  negative genitourinary   Musculoskeletal negative musculoskeletal ROS (+)   Abdominal   Peds negative pediatric ROS (+)  Hematology negative hematology ROS (+)   Anesthesia Other Findings   Reproductive/Obstetrics (+) Pregnancy                             Anesthesia Physical Anesthesia Plan  ASA: II  Anesthesia Plan: Epidural   Post-op Pain Management:    Induction:   PONV Risk Score and Plan:   Airway Management Planned: Natural Airway  Additional Equipment:   Intra-op Plan:   Post-operative Plan:   Informed Consent: I have reviewed the patients History and Physical, chart, labs and discussed the procedure including the risks, benefits and alternatives for the proposed anesthesia with the patient or authorized representative who has indicated his/her understanding and acceptance.     Dental advisory given  Plan Discussed with: Anesthesiologist  Anesthesia Plan Comments:         Anesthesia Quick Evaluation  

## 2016-10-11 ENCOUNTER — Encounter (HOSPITAL_COMMUNITY): Payer: Self-pay

## 2016-10-11 DIAGNOSIS — O99824 Streptococcus B carrier state complicating childbirth: Secondary | ICD-10-CM

## 2016-10-11 DIAGNOSIS — O3663X0 Maternal care for excessive fetal growth, third trimester, not applicable or unspecified: Secondary | ICD-10-CM

## 2016-10-11 DIAGNOSIS — Z3A39 39 weeks gestation of pregnancy: Secondary | ICD-10-CM

## 2016-10-11 DIAGNOSIS — O403XX0 Polyhydramnios, third trimester, not applicable or unspecified: Secondary | ICD-10-CM

## 2016-10-11 LAB — CBC
HCT: 30.2 % — ABNORMAL LOW (ref 36.0–46.0)
HEMOGLOBIN: 10.1 g/dL — AB (ref 12.0–15.0)
MCH: 29.5 pg (ref 26.0–34.0)
MCHC: 33.4 g/dL (ref 30.0–36.0)
MCV: 88.3 fL (ref 78.0–100.0)
PLATELETS: 162 10*3/uL (ref 150–400)
RBC: 3.42 MIL/uL — AB (ref 3.87–5.11)
RDW: 18.3 % — ABNORMAL HIGH (ref 11.5–15.5)
WBC: 13.1 10*3/uL — AB (ref 4.0–10.5)

## 2016-10-11 MED ORDER — KETOROLAC TROMETHAMINE 30 MG/ML IJ SOLN
INTRAMUSCULAR | Status: AC
  Filled 2016-10-11: qty 1

## 2016-10-11 MED ORDER — DIPHENHYDRAMINE HCL 50 MG/ML IJ SOLN
12.5000 mg | INTRAMUSCULAR | Status: DC | PRN
Start: 2016-10-11 — End: 2016-10-11

## 2016-10-11 MED ORDER — PROMETHAZINE HCL 25 MG/ML IJ SOLN: 6.2500 mg | INTRAMUSCULAR | Status: DC | PRN

## 2016-10-11 MED ORDER — DIBUCAINE 1 % RE OINT: 1.0000 "application " | TOPICAL_OINTMENT | RECTAL | Status: DC | PRN

## 2016-10-11 MED ORDER — OXYCODONE HCL 5 MG PO TABS
5.0000 mg | ORAL_TABLET | Freq: Once | ORAL | Status: DC | PRN
Start: 2016-10-11 — End: 2016-10-11

## 2016-10-11 MED ORDER — OXYTOCIN 10 UNIT/ML IJ SOLN
INTRAMUSCULAR | Status: AC
  Filled 2016-10-11: qty 4

## 2016-10-11 MED ORDER — LACTATED RINGERS IV SOLN
INTRAVENOUS | Status: DC | PRN
  Administered 2016-10-11: 01:00:00 via INTRAVENOUS

## 2016-10-11 MED ORDER — SCOPOLAMINE 1 MG/3DAYS TD PT72
MEDICATED_PATCH | TRANSDERMAL | Status: AC
  Filled 2016-10-11: qty 1

## 2016-10-11 MED ORDER — OXYTOCIN 10 UNIT/ML IJ SOLN
INTRAMUSCULAR | Status: DC | PRN
  Administered 2016-10-11: 40 [IU] via INTRAVENOUS

## 2016-10-11 MED ORDER — MORPHINE SULFATE (PF) 0.5 MG/ML IJ SOLN
INTRAMUSCULAR | Status: DC | PRN
  Administered 2016-10-11: 4 mg via EPIDURAL

## 2016-10-11 MED ORDER — DIPHENHYDRAMINE HCL 25 MG PO CAPS: 25.0000 mg | ORAL_CAPSULE | Freq: Four times a day (QID) | ORAL | Status: DC | PRN

## 2016-10-11 MED ORDER — SODIUM CHLORIDE 0.9 % IR SOLN
Status: DC | PRN
  Administered 2016-10-11: 1

## 2016-10-11 MED ORDER — MORPHINE SULFATE (PF) 0.5 MG/ML IJ SOLN
INTRAMUSCULAR | Status: AC
  Filled 2016-10-11: qty 10

## 2016-10-11 MED ORDER — ZOLPIDEM TARTRATE 5 MG PO TABS: 5.0000 mg | ORAL_TABLET | Freq: Every evening | ORAL | Status: DC | PRN

## 2016-10-11 MED ORDER — COCONUT OIL OIL
1.0000 "application " | TOPICAL_OIL | Status: DC | PRN
  Administered 2016-10-13: 1 via TOPICAL
  Filled 2016-10-11: qty 120

## 2016-10-11 MED ORDER — SODIUM BICARBONATE 8.4 % IV SOLN
INTRAVENOUS | Status: AC
  Filled 2016-10-11: qty 50

## 2016-10-11 MED ORDER — KETOROLAC TROMETHAMINE 30 MG/ML IJ SOLN: 30.0000 mg | Freq: Four times a day (QID) | INTRAMUSCULAR | Status: DC | PRN

## 2016-10-11 MED ORDER — NALOXONE HCL 0.4 MG/ML IJ SOLN
0.4000 mg | INTRAMUSCULAR | Status: DC | PRN
Start: 2016-10-11 — End: 2016-10-11

## 2016-10-11 MED ORDER — SCOPOLAMINE 1 MG/3DAYS TD PT72: 1.0000 | MEDICATED_PATCH | Freq: Once | TRANSDERMAL | Status: DC

## 2016-10-11 MED ORDER — IBUPROFEN 600 MG PO TABS
600.0000 mg | ORAL_TABLET | Freq: Four times a day (QID) | ORAL | Status: DC
  Administered 2016-10-11 – 2016-10-14 (×13): 600 mg via ORAL
  Filled 2016-10-11 (×12): qty 1

## 2016-10-11 MED ORDER — CEFAZOLIN SODIUM-DEXTROSE 2-3 GM-% IV SOLR
INTRAVENOUS | Status: DC | PRN
  Administered 2016-10-11: 2 g via INTRAVENOUS

## 2016-10-11 MED ORDER — NALBUPHINE HCL 10 MG/ML IJ SOLN
5.0000 mg | Freq: Once | INTRAMUSCULAR | Status: DC | PRN
  Filled 2016-10-11: qty 0.5

## 2016-10-11 MED ORDER — LACTATED RINGERS IV SOLN
INTRAVENOUS | Status: DC | PRN
  Administered 2016-10-11 (×2): via INTRAVENOUS

## 2016-10-11 MED ORDER — OXYTOCIN 40 UNITS IN LACTATED RINGERS INFUSION - SIMPLE MED: 2.5000 [IU]/h | INTRAVENOUS | Status: AC

## 2016-10-11 MED ORDER — ONDANSETRON HCL 4 MG/2ML IJ SOLN
INTRAMUSCULAR | Status: AC
  Filled 2016-10-11: qty 2

## 2016-10-11 MED ORDER — SODIUM CHLORIDE 0.9% FLUSH: 3.0000 mL | INTRAVENOUS | Status: DC | PRN

## 2016-10-11 MED ORDER — DIPHENHYDRAMINE HCL 25 MG PO CAPS
25.0000 mg | ORAL_CAPSULE | ORAL | Status: DC | PRN
  Filled 2016-10-11: qty 1

## 2016-10-11 MED ORDER — OXYCODONE-ACETAMINOPHEN 5-325 MG PO TABS: 1.0000 | ORAL_TABLET | ORAL | Status: DC | PRN

## 2016-10-11 MED ORDER — SIMETHICONE 80 MG PO CHEW
80.0000 mg | CHEWABLE_TABLET | Freq: Three times a day (TID) | ORAL | Status: DC
  Administered 2016-10-11 – 2016-10-14 (×9): 80 mg via ORAL
  Filled 2016-10-11 (×10): qty 1

## 2016-10-11 MED ORDER — LIDOCAINE-EPINEPHRINE (PF) 2 %-1:200000 IJ SOLN
INTRAMUSCULAR | Status: AC
  Filled 2016-10-11: qty 20

## 2016-10-11 MED ORDER — ONDANSETRON HCL 4 MG/2ML IJ SOLN: 4.0000 mg | Freq: Three times a day (TID) | INTRAMUSCULAR | Status: DC | PRN

## 2016-10-11 MED ORDER — HYDROMORPHONE HCL 1 MG/ML IJ SOLN: 0.2500 mg | INTRAMUSCULAR | Status: DC | PRN

## 2016-10-11 MED ORDER — MENTHOL 3 MG MT LOZG: 1.0000 | LOZENGE | OROMUCOSAL | Status: DC | PRN

## 2016-10-11 MED ORDER — NALOXONE HCL 2 MG/2ML IJ SOSY
1.0000 ug/kg/h | PREFILLED_SYRINGE | INTRAVENOUS | Status: DC | PRN
  Filled 2016-10-11: qty 2

## 2016-10-11 MED ORDER — SENNOSIDES-DOCUSATE SODIUM 8.6-50 MG PO TABS
2.0000 | ORAL_TABLET | ORAL | Status: DC
  Administered 2016-10-12 – 2016-10-14 (×3): 2 via ORAL
  Filled 2016-10-11 (×3): qty 2

## 2016-10-11 MED ORDER — PRENATAL MULTIVITAMIN CH
1.0000 | ORAL_TABLET | Freq: Every day | ORAL | Status: DC
  Administered 2016-10-11 – 2016-10-14 (×4): 1 via ORAL
  Filled 2016-10-11 (×4): qty 1

## 2016-10-11 MED ORDER — MEASLES, MUMPS & RUBELLA VAC ~~LOC~~ INJ
0.5000 mL | INJECTION | Freq: Once | SUBCUTANEOUS | Status: DC
  Filled 2016-10-11: qty 0.5

## 2016-10-11 MED ORDER — BUPIVACAINE HCL (PF) 0.5 % IJ SOLN
INTRAMUSCULAR | Status: AC
  Filled 2016-10-11: qty 30

## 2016-10-11 MED ORDER — NALBUPHINE HCL 10 MG/ML IJ SOLN
5.0000 mg | INTRAMUSCULAR | Status: DC | PRN
  Filled 2016-10-11: qty 0.5

## 2016-10-11 MED ORDER — OXYCODONE-ACETAMINOPHEN 5-325 MG PO TABS: 2.0000 | ORAL_TABLET | ORAL | Status: DC | PRN

## 2016-10-11 MED ORDER — SIMETHICONE 80 MG PO CHEW
80.0000 mg | CHEWABLE_TABLET | ORAL | Status: DC
  Administered 2016-10-12 – 2016-10-14 (×3): 80 mg via ORAL
  Filled 2016-10-11 (×3): qty 1

## 2016-10-11 MED ORDER — SIMETHICONE 80 MG PO CHEW: 80.0000 mg | CHEWABLE_TABLET | ORAL | Status: DC | PRN

## 2016-10-11 MED ORDER — CEFAZOLIN SODIUM-DEXTROSE 2-4 GM/100ML-% IV SOLN
INTRAVENOUS | Status: AC
  Filled 2016-10-11: qty 100

## 2016-10-11 MED ORDER — LACTATED RINGERS IV SOLN
INTRAVENOUS | Status: DC
  Administered 2016-10-11 (×2): via INTRAVENOUS

## 2016-10-11 MED ORDER — ACETAMINOPHEN 325 MG PO TABS
650.0000 mg | ORAL_TABLET | ORAL | Status: DC | PRN
  Administered 2016-10-11 – 2016-10-13 (×2): 650 mg via ORAL
  Filled 2016-10-11 (×2): qty 2

## 2016-10-11 MED ORDER — BUPIVACAINE HCL (PF) 0.5 % IJ SOLN
INTRAMUSCULAR | Status: DC | PRN
  Administered 2016-10-11: 30 mL

## 2016-10-11 MED ORDER — KETOROLAC TROMETHAMINE 30 MG/ML IJ SOLN
30.0000 mg | Freq: Four times a day (QID) | INTRAMUSCULAR | Status: DC | PRN
  Administered 2016-10-11: 30 mg via INTRAMUSCULAR

## 2016-10-11 MED ORDER — OXYCODONE HCL 5 MG/5ML PO SOLN: 5.0000 mg | Freq: Once | ORAL | Status: DC | PRN

## 2016-10-11 MED ORDER — SCOPOLAMINE 1 MG/3DAYS TD PT72
MEDICATED_PATCH | TRANSDERMAL | Status: DC | PRN
  Administered 2016-10-11: 1 via TRANSDERMAL

## 2016-10-11 MED ORDER — TETANUS-DIPHTH-ACELL PERTUSSIS 5-2.5-18.5 LF-MCG/0.5 IM SUSP: 0.5000 mL | Freq: Once | INTRAMUSCULAR | Status: DC

## 2016-10-11 MED ORDER — WITCH HAZEL-GLYCERIN EX PADS: 1.0000 "application " | MEDICATED_PAD | CUTANEOUS | Status: DC | PRN

## 2016-10-11 MED ORDER — ONDANSETRON HCL 4 MG/2ML IJ SOLN
INTRAMUSCULAR | Status: DC | PRN
  Administered 2016-10-11: 4 mg via INTRAVENOUS

## 2016-10-11 NOTE — Brief Op Note (Signed)
10/10/2016 - 10/11/2016  1:29 AM  PATIENT:  Savannah [name]  41 y.o. female  PRE-OPERATIVE DIAGNOSIS:  CESAREAN SECTION  POST-OPERATIVE DIAGNOSIS:  CESAREAN SECTION  PROCEDURE:  Procedure(s): CESAREAN SECTION (N/A)  SURGEON:  Surgeon(s) and Role:    * Willodean RosenthalHarraway-Smith, Ghazi Rumpf, MD - Primary  ANESTHESIA:   epidural  EBL:  Total I/O In: 1800 [I.V.:1800] Out: 1750 [Urine:950; Blood:800]  BLOOD ADMINISTERED:none  DRAINS: none   LOCAL MEDICATIONS USED:  MARCAINE     SPECIMEN:  Source of Specimen:  Labor and delivery  DISPOSITION OF SPECIMEN:  PATHOLOGY  COUNTS:  YES  TOURNIQUET:  * No tourniquets in log *  DICTATION: .Note written in EPIC  PLAN OF CARE: Admit to inpatient   PATIENT DISPOSITION:  PACU - hemodynamically stable.   Delay start of Pharmacological VTE agent (>24hrs) due to surgical blood loss or risk of bleeding: yes  Complications: none immediate  Jeff Mccallum L. Harraway-Smith, M.D., Evern CoreFACOG

## 2016-10-11 NOTE — Addendum Note (Signed)
Addendum  created 10/11/16 0742 by Angela AdamWrinkle, Philisha Weinel G, CRNA   Sign clinical note

## 2016-10-11 NOTE — Op Note (Signed)
10/10/2016 - 10/11/2016  1:29 AM  PATIENT:  Savannah [name]  41 y.o. female  PRE-OPERATIVE DIAGNOSIS:  CESAREAN SECTION  POST-OPERATIVE DIAGNOSIS:  CESAREAN SECTION  PROCEDURE:  Procedure(s): CESAREAN SECTION (N/A)  SURGEON:  Surgeon(s) and Role:    * Willodean RosenthalHarraway-Smith, Terrie Grajales, MD - Primary  ANESTHESIA:   epidural  EBL:  Total I/O In: 1800 [I.V.:1800] Out: 1750 [Urine:950; Blood:800]  BLOOD ADMINISTERED:none  DRAINS: none   LOCAL MEDICATIONS USED:  MARCAINE     SPECIMEN:  Source of Specimen:  Labor and delivery  DISPOSITION OF SPECIMEN:  PATHOLOGY  COUNTS:  YES  TOURNIQUET:  * No tourniquets in log *  DICTATION: .Note written in EPIC  PLAN OF CARE: Admit to inpatient   PATIENT DISPOSITION:  PACU - hemodynamically stable.   Delay start of Pharmacological VTE agent (>24hrs) due to surgical blood loss or risk of bleeding: yes  Complications: none immediate  INDICATIONS: Savannah IdaGrace D. Verl BangsLegbedze is a 41 y.o. G1P1001 at 246w2d here for cesarean section secondary to the indications listed under preoperative diagnosis; please see preoperative note for further details.  The risks of cesarean section were discussed with the patient including but were not limited to: bleeding which may require transfusion or reoperation; infection which may require antibiotics; injury to bowel, bladder, ureters or other surrounding organs; injury to the fetus; need for additional procedures including hysterectomy in the event of a life-threatening hemorrhage; placental abnormalities wth subsequent pregnancies, incisional problems, thromboembolic phenomenon and other postoperative/anesthesia complications.   The patient concurred with the proposed plan, giving informed written consent for the procedure.    FINDINGS:  Viable female infant in cephalic presentation.  Apgars pending.  Blood tinges amniotic fluid.  Intact placenta, three vessel cord.  Nuchal cord x1. Normal uterus, fallopian tubes and ovaries  bilaterally.  PROCEDURE IN DETAIL:  The patient preoperatively received intravenous antibiotics and had sequential compression devices applied to her lower extremities.  She was then taken to the operating room where spinal anesthesia was administered and was found to be adequate. She was then placed in a dorsal supine position with a leftward tilt, and prepped and draped in a sterile manner.  A foley catheter was placed into her bladder and attached to constant gravity.  After an adequate timeout was performed, a Pfannenstiel skin incision was made with scalpel and carried through to the underlying layer of fascia. The fascia was incised in the midline, and this incision was extended bilaterally using the Mayo scissors.  Kocher clamps were applied to the superior aspect of the fascial incision and the underlying rectus muscles were dissected off bluntly. A similar process was carried out on the inferior aspect of the fascial incision. The rectus muscles were separated in the midline bluntly and the peritoneum was entered bluntly.  Attention was turned to the lower uterine segment where a low transverse hysterotomy incision was made with a scalpel and extended bilaterally bluntly.  The infant was successfully delivered, the cord was clamped and cut and the infant was handed over to awaiting neonatology team. The placenta was delivered manually. Uterine massage was then administered.  The placenta was intact with a three-vessel cord. The uterus was then cleared of clot and debris.  The hysterotomy was closed with 0 Vicryl in a running locked fashion, and an imbricating layer was also placed with the same suture. The uterus was returned to the pelvis. The pelvis was cleared of all clot and debris. Hemostasis was confirmed on all surfaces.  The peritoneum  and the muscles were reapproximated using 0 Vicryl with 1 interrupted suture. The fascia was then closed using 0 Vicryl.  The subcutaneous layer was irrigated, then  reapproximated with 3-0 vicryl in a running locked fashion, and the skin was closed with a 4-0 Vicryl subcuticular stitch.  30 cc of 0.5% marcaine was injected into the incision and benzoin and steristrips were applied.  The patient tolerated the procedure well. Sponge, lap, instrument and needle counts were correct x 2.  She was taken to the recovery room in stable condition.   Ayonna Speranza L. Harraway-Smith, M.D., Cherlynn June

## 2016-10-11 NOTE — Lactation Note (Signed)
This note was copied from a baby's chart. Lactation Consultation Note  Patient Name: Girl Peggye FormGrace Michalik ZOXWR'UToday's Date: 10/11/2016 Reason for consult: Initial assessment   Initial consult with first time mom of 10 hour old infant. Infant STS with mom, quietly alert, not cueing to feed. Mom was attempting to get infant to latch, infant was not interested. Infant was left STS with mom. Enc mom to feed infan tSTS 8-12 x in 24 hours at first feeding cues. Discussed normal NB feeding behavior, feeding cues, colostrum, milk coming to volume, hand expression, infant stomach size, NB nutritional needs and NB nutritional needs.  Showed mom how to hand express, no colostrum obtained from either breast. Mom with semi compressible breasts and areola with small everted nipples. Left nipple is bifurcated. Worked with mom on some positioning and trying to get infant to latch.   BF Resources Handout and LC Brochure given, informed of IP/OP services, BF Support Groups and LC phone #. Mom reports she is planning to apply for Medicaid and WIC, [facility]. Mom does not have a pump at home.   Mom without further questions/concerns at this time.    Maternal Data Formula Feeding for Exclusion: Yes Reason for exclusion: Mother's choice to formula and breast feed on admission Has patient been taught Hand Expression?: Yes Does the patient have breastfeeding experience prior to this delivery?: No  Feeding Feeding Type: Breast Fed  LATCH Score/Interventions Latch: Too sleepy or reluctant, no latch achieved, no sucking elicited. Intervention(s): Skin to skin;Teach feeding cues;Waking techniques Intervention(s): Adjust position;Breast compression;Breast massage;Assist with latch  Audible Swallowing: None Intervention(s): Skin to skin;Hand expression  Type of Nipple: Everted at rest and after stimulation  Comfort (Breast/Nipple): Soft / non-tender     Hold (Positioning): Assistance needed to correctly  position infant at breast and maintain latch. Intervention(s): Breastfeeding basics reviewed;Support Pillows;Position options;Skin to skin  LATCH Score: 5  Lactation Tools Discussed/Used WIC Program: No (Plans to apply)   Consult Status Consult Status: Follow-up Date: 10/12/16 Follow-up type: In-patient    Silas FloodSharon S Hice 10/11/2016, 11:02 AM

## 2016-10-11 NOTE — Progress Notes (Signed)
Patient ID: Savannah [name], female   DOB: [birth year], 41 y.o.   MRN: [id number] Pt with cervical dilation prev to 4/100 now with Cat II tracing remote from delivery in a macrocosmic fetus. The cervix is now beginning to swell and the fetal head is well above the cervix and out of the pelvis.  I have reviewed the fetal HR and the size of the bay with the family. I do not believe that a vaginal delivery is likely and given the FHR would rec a cesarean section . The family concurs. The risks of cesarean section discussed with the patient included but were not limited to: bleeding which may require transfusion or reoperation; infection which may require antibiotics; injury to bowel, bladder, ureters or other surrounding organs; injury to the fetus; need for additional procedures including hysterectomy in the event of a life-threatening hemorrhage; placental abnormalities wth subsequent pregnancies, incisional problems, thromboembolic phenomenon and other postoperative/anesthesia complications. The patient concurred with the proposed plan, giving informed written consent for the procedure.   Patient is NPO and she will remain NPO for procedure. Anesthesia and OR aware. Preoperative prophylactic antibiotics and SCDs ordered on call to the OR.  To OR when ready.  Agam Davenport L. Harraway-Smith, M.D., Evern CoreFACOG

## 2016-10-11 NOTE — Transfer of Care (Signed)
Immediate Anesthesia Transfer of Care Note  Patient: Savannah [name]  Procedure(s) Performed: Procedure(s): CESAREAN SECTION (N/A)  Patient Location: PACU  Anesthesia Type:Epidural  Level of Consciousness: awake  Airway & Oxygen Therapy: Patient Spontanous Breathing  Post-op Assessment: Report given to RN and Post -op Vital signs reviewed and stable  Post vital signs: stable  Last Vitals:  Vitals:   10/10/16 2330 10/11/16 0000  BP: 102/64 103/84  Pulse: (!) 114 (!) 101  Resp: 16 16  Temp:      Last Pain:  Vitals:   10/10/16 2300  TempSrc: Oral  PainSc:          Complications: No apparent anesthesia complications

## 2016-10-11 NOTE — Anesthesia Postprocedure Evaluation (Signed)
Anesthesia Post Note  Patient: Savannah [name]  Procedure(s) Performed: Procedure(s) (LRB): CESAREAN SECTION (N/A)     Patient location during evaluation: Mother Baby Anesthesia Type: Epidural Level of consciousness: awake and alert, oriented and patient cooperative Pain management: pain level controlled Vital Signs Assessment: post-procedure vital signs reviewed and stable Respiratory status: spontaneous breathing Cardiovascular status: stable Postop Assessment: no headache, epidural receding, patient able to bend at knees and no signs of nausea or vomiting Anesthetic complications: no Comments: Pain score 0.      Last Vitals:  Vitals:   10/11/16 0430 10/11/16 0530  BP: 105/63 (!) 103/57  Pulse: 96 96  Resp: 18 18  Temp: 36.6 C 37.1 C    Last Pain:  Vitals:   10/11/16 0530  TempSrc: Oral  PainSc: 0-No pain   Pain Goal:                 [facility] Hospital[facility]LE,Kortlynn Poust

## 2016-10-11 NOTE — Consult Note (Signed)
Neonatology Note:   Attendance at C-section:    I was asked by Dr. Harraway-Smith to attend this primary C/S at term for fetal intolerance to labor. The mother is a G1, GBS + aIAP with good prenatal care. ROM 16 hours before delivery, fluid clear. Infant vigorous with good spontaneous cry and tone. +60 sec DCC.  Needed only minimal bulb suctioning. Ap 8/9. Lungs clear to ausc in DR. To CN to care of Pediatrician.  Silvino Selman C. Alnisa Hasley, MD 

## 2016-10-11 NOTE — Anesthesia Postprocedure Evaluation (Signed)
Anesthesia Post Note  Patient: Savannah [name]  Procedure(s) Performed: Procedure(s) (LRB): CESAREAN SECTION (N/A)     Patient location during evaluation: Mother Baby Anesthesia Type: Epidural Level of consciousness: awake and alert Pain management: pain level controlled Vital Signs Assessment: post-procedure vital signs reviewed and stable Respiratory status: spontaneous breathing, nonlabored ventilation and respiratory function stable Cardiovascular status: stable Postop Assessment: no headache, no backache and epidural receding Anesthetic complications: no    Last Vitals:  Vitals:   10/11/16 0130 10/11/16 0145  BP: (!) 104/48 123/70  Pulse: (!) 101 (!) 109  Resp: (!) 23 (!) 23  Temp:  37.2 C    Last Pain:  Vitals:   10/11/16 0145  TempSrc:   PainSc: 1    Pain Goal:                 Lowella CurbWarren Ray Xcaret Morad

## 2016-10-12 ENCOUNTER — Encounter (HOSPITAL_COMMUNITY): Payer: Self-pay | Admitting: Obstetrics & Gynecology

## 2016-10-12 NOTE — Progress Notes (Signed)
Subjective: Postpartum Day 1: Cesarean Delivery Patient reports incisional pain, tolerating PO, + flatus and no problems voiding.    Objective: Vital signs in last 24 hours: Temp:  [97.9 F (36.6 C)-98.8 F (37.1 C)] 97.9 F (36.6 C) (06/30 0504) Pulse Rate:  [83-98] 88 (06/30 0504) Resp:  [16-20] 20 (06/30 0504) BP: (103-121)/(60-68) 113/63 (06/30 0131) SpO2:  [96 %-99 %] 96 % (06/30 0504)  Physical Exam:  General: alert and no distress Lochia: appropriate Uterine Fundus: firm Incision: healing well, no significant drainage DVT Evaluation: No evidence of DVT seen on physical exam. Negative Homan's sign. No cords or calf tenderness. No significant calf/ankle edema.   Recent Labs  10/10/16 0129 10/11/16 0550  HGB 12.4 10.1*  HCT 37.2 30.2*    Assessment/Plan: Status post Cesarean section. Doing well postoperatively.  Condoms for contraception Continue current care.  Jaynie CollinsUgonna Avaleigh Decuir, MD 10/12/2016, 8:58 AM

## 2016-10-13 NOTE — Progress Notes (Signed)
POSTPARTUM PROGRESS NOTE  Post Partum Day 2 Subjective:  Savannah [name] is a 41 y.o. G1P1001 5171w2d s/p pltcs.  No acute events overnight.  Pt denies problems with ambulating, voiding or po intake.  She denies nausea or vomiting.  Pain is moderately controlled.  She has had flatus. She has not had bowel movement.  Lochia Small.   Objective: Blood pressure 117/61, pulse 98, temperature 98.5 F (36.9 C), temperature source Oral, resp. rate 16, height 5\' 7"  (1.702 m), weight 169 lb (76.7 kg), last menstrual period 01/10/2016, SpO2 100 %, unknown if currently breastfeeding.  Physical Exam:  General: alert, cooperative and no distress Lochia:normal flow Chest: CTAB Heart: RRR no m/r/g Abdomen: +BS, soft, nontender,  Uterine Fundus: firm,  DVT Evaluation: No calf swelling or tenderness Extremities: trace edema   Recent Labs  10/11/16 0550  HGB 10.1*  HCT 30.2*    Assessment/Plan:  ASSESSMENT: Savannah [name] is a 41 y.o. G1P1001 6471w2d s/p pltcs, doing well. Has partially saturated honeycomb, will have nursing change and apply pressure dressing.  Plan for discharge tomorrow   LOS: 3 days   Silvano Bilisoah B Meral Geissinger 10/13/2016, 7:51 AM

## 2016-10-13 NOTE — Lactation Note (Signed)
This note was copied from a baby's chart. Lactation Consultation Note  Patient Name: Savannah [name] ZOXWR'UToday's Date: 10/13/2016 Reason for consult: Follow-up assessment Mom states the baby is feeding well.  She did send baby to nursery for a bottle this AM because she was exhausted.  Mom recently pumped with manual pump and obtained about 15 mls of transitional milk.  Mom c/o breast fullness.  Both breasts are full and warm.  Baby is at a 8% weight loss.  Mom would like to try using the DEBP.  Symphony pump set up and initiated.  Instructed on use and cleaning of parts.  Instructed to give any milk expressed to baby.  Mom states she understands.  Encouraged to call for concerns or assist.  Maternal Data    Feeding    LATCH Score/Interventions                      Lactation Tools Discussed/Used Pump Review: Setup, frequency, and cleaning;Milk Storage Initiated by:: LC Date initiated:: 10/13/16   Consult Status Consult Status: Follow-up Date: 10/14/16 Follow-up type: In-patient    Huston FoleyMOULDEN, Jhonnie Aliano S 10/13/2016, 2:30 PM

## 2016-10-14 MED ORDER — OXYCODONE-ACETAMINOPHEN 5-325 MG PO TABS: 1.0000 | ORAL_TABLET | ORAL | 0 refills | Status: DC | PRN

## 2016-10-14 MED ORDER — IBUPROFEN 600 MG PO TABS: 600.0000 mg | ORAL_TABLET | Freq: Four times a day (QID) | ORAL | 0 refills | Status: DC | PRN

## 2016-10-14 NOTE — Lactation Note (Signed)
This note was copied from a baby's chart. Lactation Consultation Note: infant is 4080 hours old and is 7 % weight loss today. Mothers breast are filling. Assist mother with pumping her breast. Mother advised to do good breast massage between feedings and to ice breast for 15 mins every 3-4 hours. Father informed LC that they want to supplement with formula if mother doesn't have enough milk. Mother advised to continue to breast feed every 8-12 times in 24 hours. Mother to offer breast after she finishes pumping. Mother has a harmony hand pump for use at home. Mother is not active with WIC. [facility]   Patient Name: Girl Peggye FormGrace Kham ZOXWR'UToday's Date: 10/14/2016     Maternal Data    Feeding Feeding Type: Bottle Fed - Formula (fed by RN, FOB asleep, mom pumping) Nipple Type: Slow - flow Length of feed: 10 min (mom engorged R>L breast baby tongue thrust to palate)  LATCH Score/Interventions Latch: Repeated attempts needed to sustain latch, nipple held in mouth throughout feeding, stimulation needed to elicit sucking reflex. Intervention(s): Skin to skin;Waking techniques Intervention(s): Adjust position;Assist with latch;Breast compression  Audible Swallowing: A few with stimulation Intervention(s): Skin to skin  Type of Nipple: Everted at rest and after stimulation Intervention(s): Double electric pump  Comfort (Breast/Nipple): Filling, red/small blisters or bruises, mild/mod discomfort Problem noted: Engorgment (R > L breast) Intervention(s): Ice  Problem noted: Mild/Moderate discomfort Interventions (Mild/moderate discomfort): Post-pump  Hold (Positioning): Assistance needed to correctly position infant at breast and maintain latch.  LATCH Score: 6  Lactation Tools Discussed/Used     Consult Status      Michel BickersKendrick, Deannie Resetar McCoy 10/14/2016, 8:58 AM

## 2016-10-14 NOTE — Lactation Note (Signed)
This note was copied from a baby's chart. Lactation Consultation Note: Mother assist with pre-pumping Rt breast. Infant was placed in football hold . Infant latched on with the initial latch painful for mother. After a few mins infant was suckling and swallowing with good rhythmic pattern. Infant sustained latch for 30 mins. Mother taught breast compression and off sided latch. Infant was placed in cross cradle hold and latched on again with good depth. Infant observed with audible swallows. Mother advised to pump after breastfeeding every 2-3 hours. Mother to keep breast soft and feed [name] often at least 8-12 times daily. Reviewed signs and symptoms of Mastitis. Mother advised in treatment and prevention of engorgement. Mother informed of available LC services, [facility], OP dept. Mother has phone number if needed. [facility]   Patient Name: Savannah [name] [id number]'XToday's Date: 10/14/2016 Reason for consult: Follow-up assessment   Maternal Data    Feeding Feeding Type: Breast Fed Nipple Type: Slow - flow Length of feed: 30 min  LATCH Score/Interventions Latch: Grasps breast easily, tongue down, lips flanged, rhythmical sucking.  Audible Swallowing: Spontaneous and intermittent  Type of Nipple: Everted at rest and after stimulation  Comfort (Breast/Nipple): Filling, red/small blisters or bruises, mild/mod discomfort Problem noted: Cracked, bleeding, blisters, bruises Intervention(s): Hand expression;Reverse pressure Intervention(s): Hand pump  Problem noted: Filling Interventions (Filling): Hand pump  Hold (Positioning): Assistance needed to correctly position infant at breast and maintain latch. Intervention(s): Support Pillows;Position options;Skin to skin  LATCH Score: 8  Lactation Tools Discussed/Used     Consult Status Consult Status: Complete    Michel BickersKendrick, Francetta Ilg McCoy 10/14/2016, 10:43 AM

## 2016-10-14 NOTE — Discharge Summary (Signed)
OB Discharge Summary     Patient Name: Savannah [name] DOB: [birth year] MRN: [id number]  Date of admission: 10/10/2016 Delivering MD: Willodean RosenthalHARRAWAY-SMITH, CAROLYN   Date of discharge: 10/14/2016  Admitting diagnosis: INDUCTION Intrauterine pregnancy: 5667w2d     Secondary diagnosis:  Active Problems:   Polyhydramnios  Additional problems: AMA; LGA     Discharge diagnosis: Term Pregnancy Delivered                                                                                                Post partum procedures:none  Augmentation: Pitocin and Cytotec  Complications: None  Hospital course:  Induction of Labor With Cesarean Section  41 y.o. yo G1P1001 at 1367w2d was admitted to the hospital 10/10/2016 for induction of labor due to polyhydramnios, AFI 27cm with EFW 8+15. Patient had a labor course significant for cx ripening with cytotec, followed by SROM and then Pitocin. After approx 24 hrs pt's cervix remained 4cm and FHR tracing had become Cat II with fetal head out of the pelvis and cx swelling noted. The patient went for cesarean section due to Non-Reassuring FHR, and delivered a Viable infant, Membrane Rupture Time/Date: )9:15 AM ,10/10/2016   @Details  of operation can be found in separate operative Note.  Patient had an uncomplicated postpartum course. She is ambulating, tolerating a regular diet, passing flatus, and urinating well.  Patient is discharged home in stable condition on 10/14/16.                                    Physical exam  Vitals:   10/12/16 1759 10/13/16 0637 10/13/16 1819 10/14/16 0637  BP: 112/62 117/61 (!) 120/59 125/71  Pulse: 87 98 79 84  Resp: 16 16 17 16   Temp: 98.9 F (37.2 C) 98.5 F (36.9 C) 98.5 F (36.9 C) 98.6 F (37 C)  TempSrc: Oral Oral Axillary Axillary  SpO2: 100% 100%  100%  Weight:      Height:       General: alert and cooperative Lochia: appropriate Uterine Fundus: firm Incision: pressure dsg intact, clean and dry DVT Evaluation:  No evidence of DVT seen on physical exam. Labs: Lab Results  Component Value Date   WBC 13.1 (H) 10/11/2016   HGB 10.1 (L) 10/11/2016   HCT 30.2 (L) 10/11/2016   MCV 88.3 10/11/2016   PLT 162 10/11/2016   No flowsheet data found.  Discharge instruction: per After Visit Summary and "Baby and Me Booklet".  After visit meds:  Allergies as of 10/14/2016   No Known Allergies     Medication List    TAKE these medications   ibuprofen 600 MG tablet Commonly known as:  ADVIL,MOTRIN Take 1 tablet (600 mg total) by mouth every 6 (six) hours as needed.   oxyCODONE-acetaminophen 5-325 MG tablet Commonly known as:  PERCOCET/ROXICET Take 1 tablet by mouth every 4 (four) hours as needed (pain scale 4-7).   PRENATAL VITAMIN PLUS LOW IRON 27-1 MG Tabs Take 1 tablet by mouth daily.  Diet: routine diet  Activity: Advance as tolerated. Pelvic rest for 6 weeks.   Outpatient follow up:4 weeks Follow up Appt:Future Appointments Date Time Provider Department Center  11/20/2016 10:00 AM Rasch, Harolyn Rutherford, NP [facility]   Follow up Visit:No Follow-up on file.  Postpartum contraception: IUD Mirena  Newborn Data: Live born female  Birth Weight: 7 lb 13.4 oz (3555 g) APGAR: 8, 9  Baby Feeding: Breast Disposition:home with mother   10/14/2016 Cam Hai, CNM  9:57 AM

## 2016-11-20 ENCOUNTER — Encounter: Payer: Self-pay | Admitting: Obstetrics and Gynecology

## 2016-11-20 ENCOUNTER — Ambulatory Visit: Payer: Self-pay | Admitting: Obstetrics and Gynecology

## 2016-11-20 ENCOUNTER — Ambulatory Visit (INDEPENDENT_AMBULATORY_CARE_PROVIDER_SITE_OTHER): Payer: Medicaid Other | Admitting: Obstetrics and Gynecology

## 2016-11-20 NOTE — Progress Notes (Signed)
Subjective:     Savannah Savannah [name] is a 41 y.o. female who presents for a postpartum visit. She is 5 weeks postpartum following a low cervical transverse Cesarean section. Savannah [name] have fully reviewed the prenatal and intrapartum course. The delivery was at 39.2 gestational weeks. Outcome: primary cesarean section, low transverse incision. Anesthesia: epidural. Postpartum course has been unremarkable. Baby's course has been unremarkable. Baby is feeding by breast. Bleeding no bleeding. Bowel function is normal. Bladder function is normal. Patient is not sexually active. Contraception method is none. Depression/Anxiety screening: negative.  The following portions of the patient's history were reviewed and updated as appropriate: allergies, current medications, past family history, past medical history, past social history, past surgical history and problem list.  Review of Systems Pertinent items are noted in HPI.   Objective:    BP 119/85   Pulse 90   Ht 5\' 2"  (1.575 m)   Wt 142 lb (64.4 kg)   Breastfeeding? Yes   BMI 25.97 kg/m   General:  alert, cooperative and appears stated age  Lungs: clear to auscultation bilaterally  Heart:  regular rate and rhythm, S1, S2 normal, no murmur, click, rub or gallop  Abdomen: soft, non-tender; bowel sounds normal; no masses,  no organomegaly, cesarean incision clean, dry, intact.         Assessment:   Normal postpartum exam. Pap smear not done at today's visit. Normal in 2018  Plan:   1. Contraception: none undecided, patient is self-pay.  2. Follow up in:or as needed. 3. Call the health department if birth control is desired.   Orhan Mayorga, Savannah RutherfordJennifer I, NP

## 2017-06-04 ENCOUNTER — Encounter: Payer: Self-pay | Admitting: *Deleted

## 2018-09-07 IMAGING — US US MFM OB DETAIL+14 WK
1 series · 13 of 28 positions shown · non-contrast
Comparison: none

[Series 1: us mfm ob detail+14 wk · 13 of 74 slices shown]
[im 3/74]
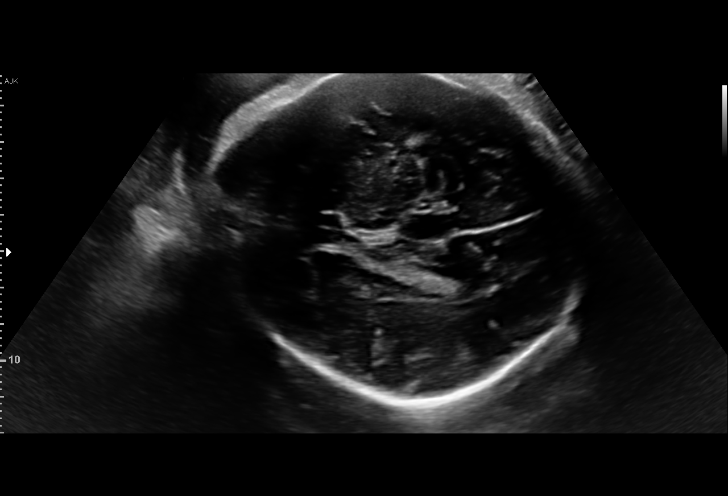
[im 9/74]
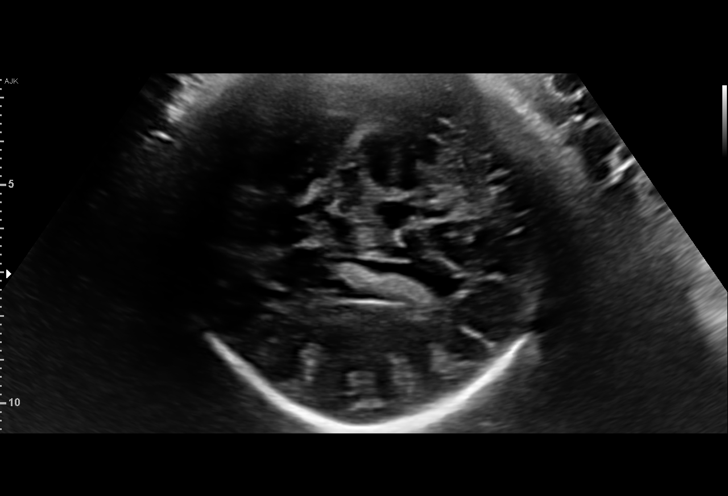
[im 14/74]
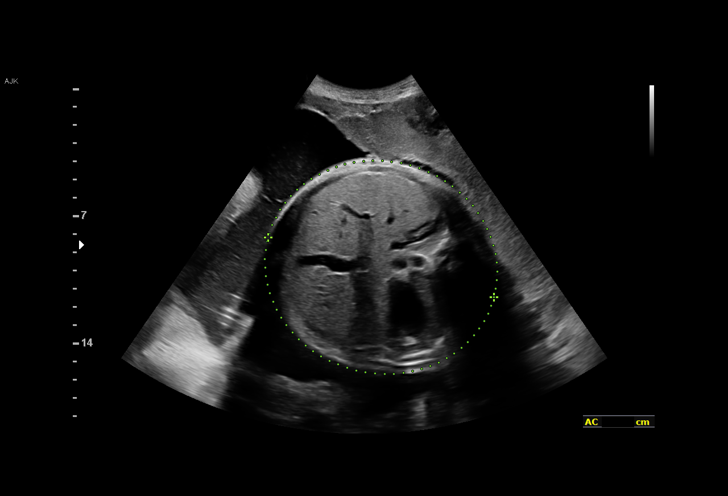
[im 19/74]
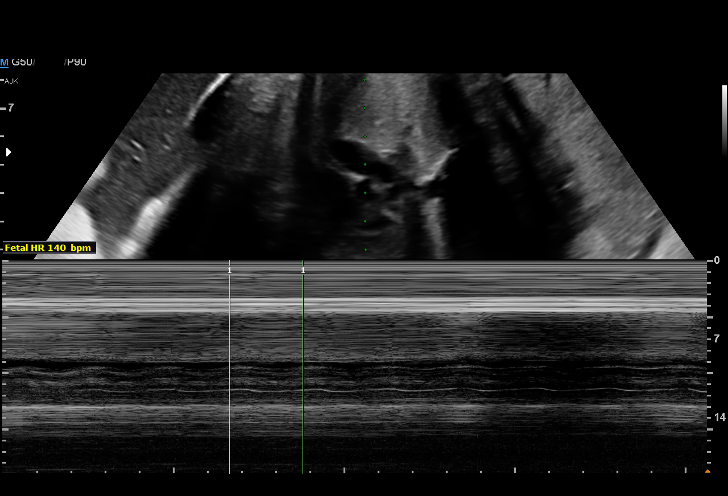
[im 25/74]
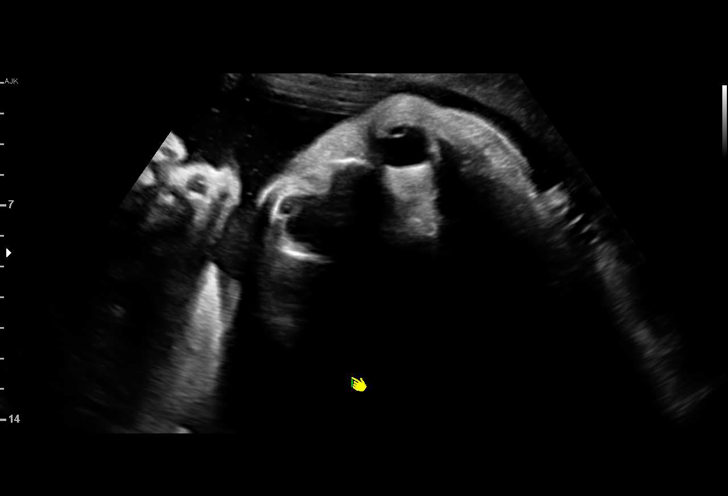
[im 30/74]
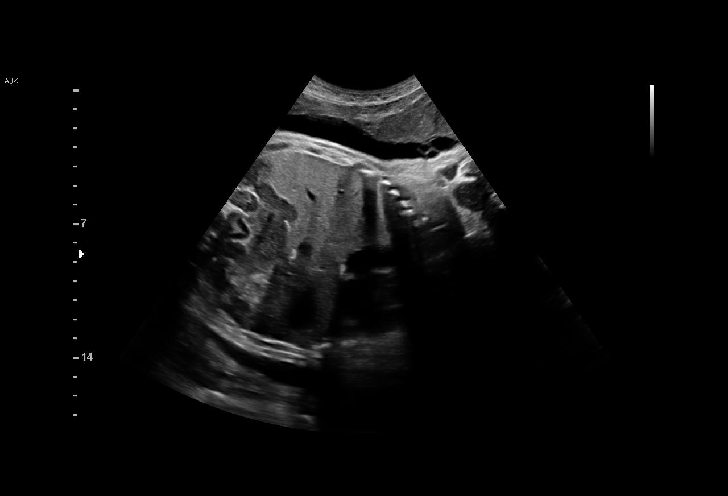
[im 38/74]
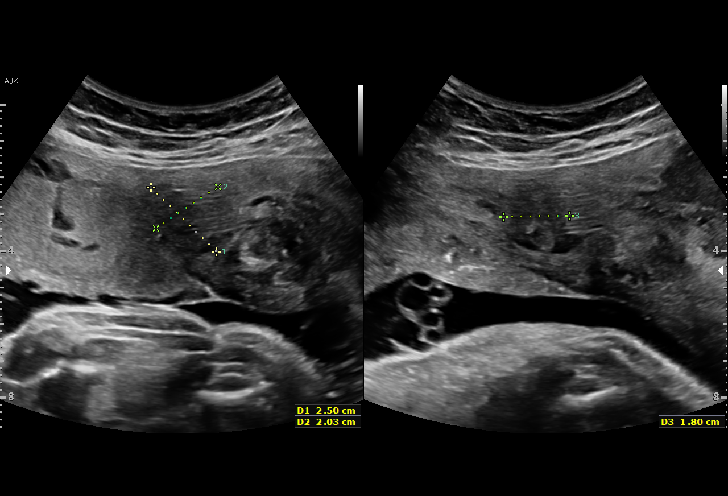
[im 44/74]
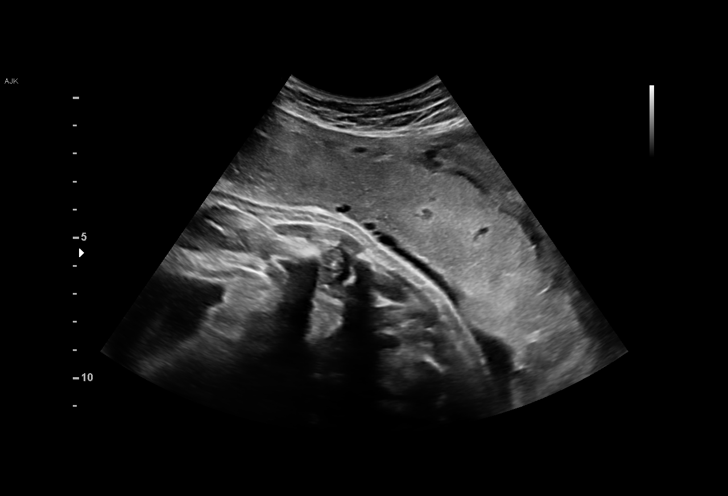
[im 49/74]
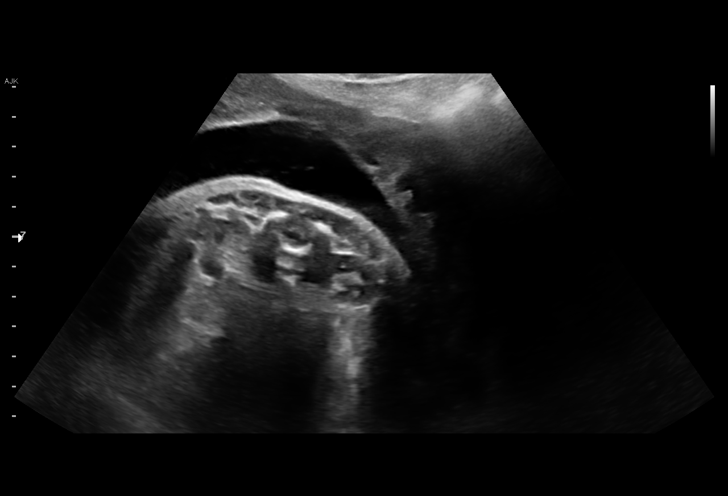
[im 55/74]
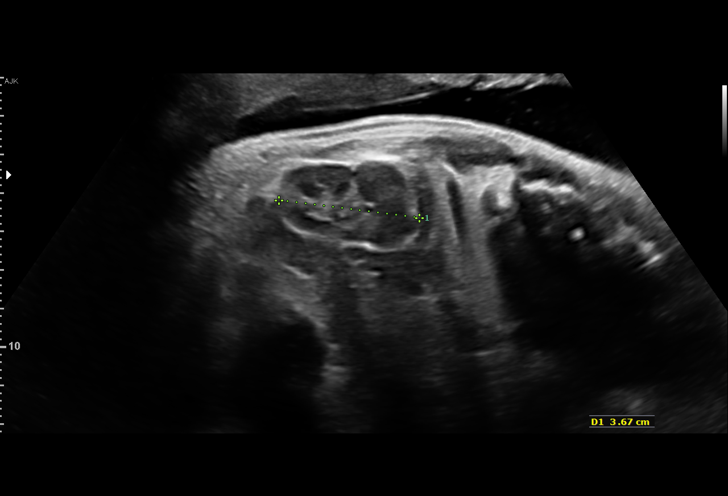
[im 60/74]
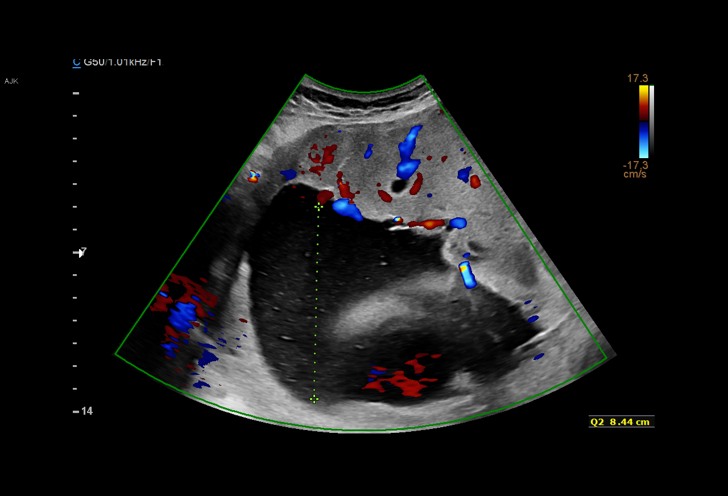
[im 65/74]
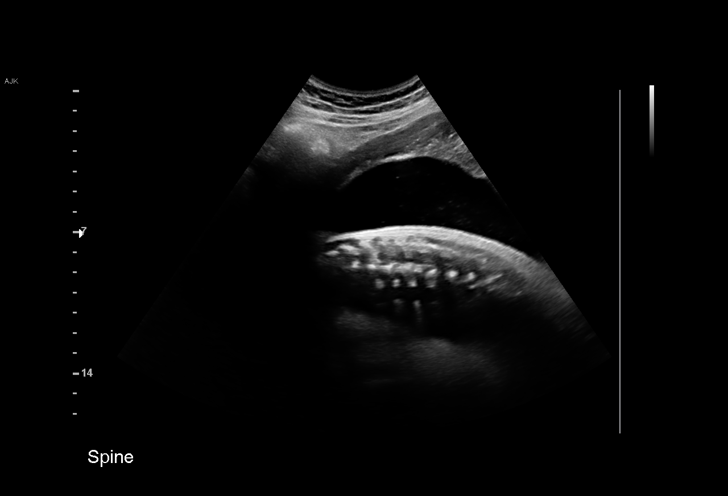
[im 71/74]
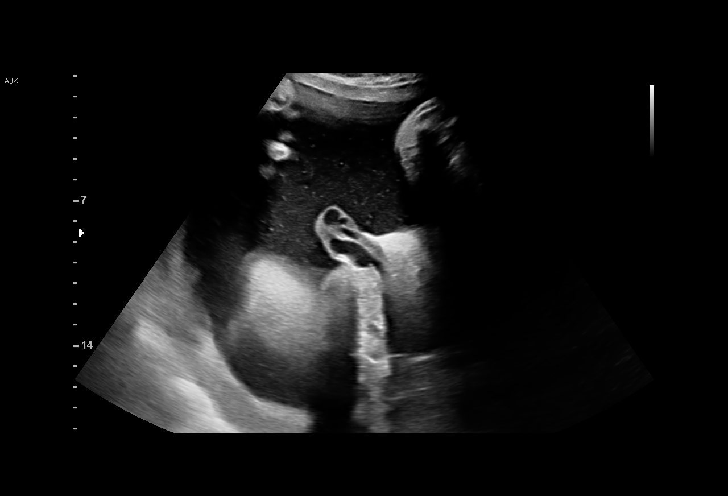

[13 of 28 positions shown; findings below may reference images not displayed]

pm)


1  HEUNGSOO PUTRA SULUNG             [id number]      [id number]     [id number]
2  HEUNGSOO PUTRA SULUNG             [id number]      [id number]     [id number]
Indications

38 weeks gestation of pregnancy
Size-Date Discrepancy
Advanced maternal age multigravida 35+,
third trimester
Encounter for fetal anatomic survey
Obesity complicating pregnancy, third
trimester
Polyhydramnios, third trimester, antepartum
condition or complication, fetus 1
OB History

Blood Type:            Height:  5'2"   Weight (lb):  168      BMI:
Gravidity:    2
Fetal Evaluation

Num Of Fetuses:     1
Fetal Heart         140
Rate(bpm):
Cardiac Activity:   Observed
Presentation:       Cephalic
Placenta:           Anterior, above cervical os
P. Cord Insertion:  Visualized, central

Amniotic Fluid
AFI FV:      Subjectively within normal limits

AFI Sum(cm)     %Tile       Largest Pocket(cm)
27.73           > 97
RUQ(cm)       RLQ(cm)       LUQ(cm)        LLQ(cm)
10.84
Biophysical Evaluation

Amniotic F.V:   Polyhydramnios             F. Tone:        Observed
F. Movement:    Not Observed               Score:          [DATE]
F. Breathing:   Observed
Biometry

BPD:      90.1  mm     G. Age:  36w 4d         32  %    CI:        76.12   %   70 - 86
FL/HC:      23.2   %   20.9 -
HC:      327.3  mm     G. Age:  37w 1d         15  %    HC/AC:      0.85       0.92 -
AC:       387   mm     G. Age:  N/A          > 97  %    FL/BPD:     84.1   %   71 - 87
FL:       75.8  mm     G. Age:  38w 6d         71  %    FL/AC:      19.6   %   20 - 24

Est. FW:    1901  gm    8 lb 15 oz    > 90  %
Gestational Age

LMP:           38w 0d       Date:   01/10/16                 EDD:   10/16/16
U/S Today:     37w 4d                                        EDD:   10/19/16
Best:          38w 0d    Det. By:   LMP  (01/10/16)          EDD:   10/16/16
Anatomy

Cranium:               Appears normal         Aortic Arch:            Not well visualized
Cavum:                 Appears normal         Ductal Arch:            Not well visualized
Ventricles:            Appears normal         Diaphragm:              Appears normal
Choroid Plexus:        Appears normal         Stomach:                Appears normal, left
sided
Cerebellum:            Appears normal         Abdomen:                Appears normal
Posterior Fossa:       Not well visualized    Abdominal Wall:         Appears nml (cord
insert, abd wall)
Nuchal Fold:           Not applicable (>20    Cord Vessels:           Appears normal (3
wks GA)                                        vessel cord)
Face:                  Appears normal         Kidneys:                Appear normal
(orbits and profile)
Lips:                  Appears normal         Bladder:                Appears normal
Heart:                 Not well visualized    Spine:                  Ltd views no
intracranial signs of
NT
RVOT:                  Not well visualized    Upper Extremities:      Visualized
LVOT:                  Not well visualized    Lower Extremities:      Not well visualized

Other:  Fetus appears to be a female. Technically difficult due to advanced
GA and fetal position.
Cervix Uterus Adnexa

Cervix
Not visualized (advanced GA >54wks)

Uterus
Multiple fibroids noted, see table below.

Left Ovary
Not visualized.
Right Ovary
Not visualized.

Adnexa:       No abnormality visualized.
Myomas

Site                     L(cm)      W(cm)      D(cm)      Location
Mid
Mid                      2.5        2
Anterior                 4.9        2          4

Blood Flow                 RI        PI       Comments

Impression

Singleton intrauterine pregnancy at 38+0 weeks, size > dates
Review of the anatomy shows no sonographic markers for
aneuploidy or structural anomalies
However, all evaluations should be considered suboptimal
secondary to late EGA and fetal position
Amniotic fluid volume is elevated with an AFI of 28cm
Estimated fetal weight is 4067g which is growth at greater
than the 90th percentile
BPP was [DATE] with minimal fetal movement
Recommendations

For NST now to complete a 10-point BPP
Given polyhydramnios and macrosomia, this patient is at risk
for undiagnosed GDM. She did not have glucose tolerance
testing during this pregnancy
She needs a Egb6SI, and if it is >6-6.5% than I would
assume she has GDM
we will schedule another BPP for [REDACTED] 10/07/2016
assuming she has a reactive NST today
Continue antenatal testing
